# Patient Record
Sex: Male | Born: 1975 | Race: White | Hispanic: No | Smoking: Never smoker
Health system: Southern US, Community
[De-identification: ages and names within clinical notes are randomized; demographics above are authoritative.]

## PROBLEM LIST (undated history)

## (undated) DIAGNOSIS — T7840XA Allergy, unspecified, initial encounter: Secondary | ICD-10-CM

## (undated) DIAGNOSIS — C169 Malignant neoplasm of stomach, unspecified: Secondary | ICD-10-CM

## (undated) DIAGNOSIS — K2211 Ulcer of esophagus with bleeding: Principal | ICD-10-CM

## (undated) DIAGNOSIS — C159 Malignant neoplasm of esophagus, unspecified: Secondary | ICD-10-CM

## (undated) HISTORY — DX: Ulcer of esophagus with bleeding: K22.11

## (undated) HISTORY — DX: Allergy, unspecified, initial encounter: T78.40XA

## (undated) HISTORY — DX: Malignant neoplasm of stomach, unspecified: C16.9

## (undated) HISTORY — DX: Malignant neoplasm of esophagus, unspecified: C15.9

---
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
## 2004-05-07 ENCOUNTER — Ambulatory Visit: Payer: Self-pay | Admitting: Internal Medicine

## 2004-11-30 ENCOUNTER — Ambulatory Visit: Payer: Self-pay | Admitting: Internal Medicine

## 2006-03-04 ENCOUNTER — Ambulatory Visit: Payer: Self-pay | Admitting: Internal Medicine

## 2006-05-19 ENCOUNTER — Ambulatory Visit: Payer: Self-pay | Admitting: Internal Medicine

## 2006-07-28 ENCOUNTER — Ambulatory Visit: Payer: Self-pay | Admitting: Internal Medicine

## 2007-02-05 ENCOUNTER — Telehealth (INDEPENDENT_AMBULATORY_CARE_PROVIDER_SITE_OTHER): Payer: Self-pay | Admitting: *Deleted

## 2007-06-30 ENCOUNTER — Ambulatory Visit: Payer: Self-pay | Admitting: Family Medicine

## 2007-06-30 DIAGNOSIS — M6200 Separation of muscle (nontraumatic), unspecified site: Secondary | ICD-10-CM

## 2007-07-08 DIAGNOSIS — J309 Allergic rhinitis, unspecified: Secondary | ICD-10-CM | POA: Insufficient documentation

## 2008-07-18 ENCOUNTER — Ambulatory Visit: Payer: Self-pay | Admitting: Family Medicine

## 2008-07-18 DIAGNOSIS — J019 Acute sinusitis, unspecified: Secondary | ICD-10-CM

## 2011-06-18 DIAGNOSIS — C169 Malignant neoplasm of stomach, unspecified: Secondary | ICD-10-CM

## 2011-06-18 DIAGNOSIS — C159 Malignant neoplasm of esophagus, unspecified: Secondary | ICD-10-CM

## 2011-06-18 HISTORY — DX: Malignant neoplasm of stomach, unspecified: C16.9

## 2011-06-18 HISTORY — DX: Malignant neoplasm of esophagus, unspecified: C15.9

## 2011-07-19 DIAGNOSIS — K2211 Ulcer of esophagus with bleeding: Secondary | ICD-10-CM

## 2011-07-19 HISTORY — DX: Ulcer of esophagus with bleeding: K22.11

## 2011-08-08 LAB — COMPREHENSIVE METABOLIC PANEL
Albumin: 3.7 g/dL (ref 3.4–5.0)
Alkaline Phosphatase: 53 U/L (ref 50–136)
Anion Gap: 9 (ref 7–16)
Bilirubin,Total: 0.3 mg/dL (ref 0.2–1.0)
Calcium, Total: 8 mg/dL — ABNORMAL LOW (ref 8.5–10.1)
Creatinine: 1 mg/dL (ref 0.60–1.30)
EGFR (African American): >60
Glucose: 126 mg/dL — ABNORMAL HIGH (ref 65–99)
Osmolality: 289 (ref 275–301)
Potassium: 4.3 mmol/L (ref 3.5–5.1)
Sodium: 140 mmol/L (ref 136–145)

## 2011-08-08 LAB — CBC
HCT: 34.1 % — ABNORMAL LOW (ref 40.0–52.0)
MCV: 91 fL (ref 80–100)
Platelet: 329 10*3/uL (ref 150–440)
RBC: 3.74 10*6/uL — ABNORMAL LOW (ref 4.40–5.90)
WBC: 20.5 10*3/uL — ABNORMAL HIGH (ref 3.8–10.6)

## 2011-08-08 LAB — TROPONIN I: Troponin-I: <0.02 ng/mL

## 2011-08-09 ENCOUNTER — Inpatient Hospital Stay: Payer: Self-pay | Admitting: Internal Medicine

## 2011-08-09 LAB — CBC WITH DIFFERENTIAL/PLATELET
Basophil %: 0.5 %
Comment - H1-Com2: NORMAL
Eosinophil #: 0 10*3/uL (ref 0.0–0.7)
Eosinophil %: 0 %
HGB: 10.6 g/dL — ABNORMAL LOW (ref 13.0–18.0)
Lymphocyte #: 1.7 10*3/uL (ref 1.0–3.6)
Lymphocyte %: 10.4 %
Lymphocytes: 10 %
MCV: 92 fL (ref 80–100)
Monocytes: 1 %
Neutrophil %: 85.4 %
Platelet: 301 10*3/uL (ref 150–440)
RBC: 3.39 10*6/uL — ABNORMAL LOW (ref 4.40–5.90)
Segmented Neutrophils: 88 %
WBC: 16.1 10*3/uL — ABNORMAL HIGH (ref 3.8–10.6)

## 2011-08-09 LAB — HEMOGLOBIN
HGB: 10.6 g/dL — ABNORMAL LOW (ref 13.0–18.0)
HGB: 9.9 g/dL — ABNORMAL LOW (ref 13.0–18.0)

## 2011-08-09 LAB — URINALYSIS, COMPLETE
Bacteria: NONE SEEN
Bilirubin,UR: NEGATIVE
Glucose,UR: 50 mg/dL (ref 0–75)
Hyaline Cast: 6
Leukocyte Esterase: NEGATIVE
Nitrite: NEGATIVE
Ph: 5 (ref 4.5–8.0)
Protein: NEGATIVE
RBC,UR: <1 /HPF (ref 0–5)
Specific Gravity: 1.024 (ref 1.003–1.030)
WBC UR: <1 /HPF (ref 0–5)

## 2011-08-10 LAB — CBC WITH DIFFERENTIAL/PLATELET
Basophil #: 0.1 10*3/uL (ref 0.0–0.1)
Basophil %: 0.8 %
Eosinophil %: 0.8 %
HGB: 9.9 g/dL — ABNORMAL LOW (ref 13.0–18.0)
Lymphocyte #: 2.7 10*3/uL (ref 1.0–3.6)
MCV: 91 fL (ref 80–100)
Monocyte %: 3.3 %
Neutrophil #: 3.6 10*3/uL (ref 1.4–6.5)
Neutrophil %: 54.1 %
RBC: 3.18 10*6/uL — ABNORMAL LOW (ref 4.40–5.90)
RDW: 12.6 % (ref 11.5–14.5)

## 2011-08-19 ENCOUNTER — Encounter: Payer: Self-pay | Admitting: Family Medicine

## 2011-08-21 ENCOUNTER — Encounter: Payer: Self-pay | Admitting: Internal Medicine

## 2011-08-21 ENCOUNTER — Ambulatory Visit (INDEPENDENT_AMBULATORY_CARE_PROVIDER_SITE_OTHER): Payer: Self-pay | Admitting: Internal Medicine

## 2011-08-21 VITALS — BP 128/70 | HR 82 | Temp 97.5°F | Ht 70.0 in | Wt 157.0 lb

## 2011-08-21 DIAGNOSIS — K2211 Ulcer of esophagus with bleeding: Secondary | ICD-10-CM

## 2011-08-21 LAB — CBC WITH DIFFERENTIAL/PLATELET
Basophils Absolute: 0 10*3/uL (ref 0.0–0.1)
HCT: 32.6 % — ABNORMAL LOW (ref 39.0–52.0)
Lymphs Abs: 3 10*3/uL (ref 0.7–4.0)
MCHC: 33.3 g/dL (ref 30.0–36.0)
MCV: 90.3 fl (ref 78.0–100.0)
Monocytes Absolute: 0.5 10*3/uL (ref 0.1–1.0)
Platelets: 476 10*3/uL — ABNORMAL HIGH (ref 150.0–400.0)
RDW: 13.2 % (ref 11.5–14.6)

## 2011-08-21 NOTE — Assessment & Plan Note (Signed)
Really a mystery No sig NSAID or alcohol use No reflux symptoms at all Would not suspect cancer at his age in non smoker, but due to the mystery, repeat EGD may be indicated Should stay on the bid omeprazole---may need long term daily Rx (will leave to Dr Bluford Kaufmann)  Will recheck Hgb Check H pylori serology though not usually associated with esophageal ulcers

## 2011-08-21 NOTE — Progress Notes (Signed)
  Subjective:    Patient ID: [name], male    DOB: 1975/07/02, 36 y.o.   MRN: [id number]  HPI Had onset of presyncopal feelings Within an hour, he started vomiting blood Someone drove him to [facility]  Records reviewed Had EGD which showed esophageal ulcer and some gastritis No chronic heartburn Very little alcohol or NSAIDs  Stools are now normal Appetite is fine  No current outpatient prescriptions on file prior to visit.    No Known Allergies  Past Medical History  Diagnosis Date  . Allergy   . Esophageal ulcer with bleeding 2/13    Hematemesis/presyncope---EGD by Dr Bluford Kaufmann    Past Surgical History  Procedure Date  . Anterior cruciate ligament repair 1995    left knee    Family History  Problem Relation Age of Onset  . Healthy Mother   . Healthy Father   . Cancer Maternal Grandmother     breast cancer  . Cancer Maternal Grandfather     prostate cancer  . Diabetes Neg Hx   . Heart disease Neg Hx     History   Social History  . Marital Status: Divorced    Spouse Name: N/A    Number of Children: 1  . Years of Education: N/A   Occupational History  . Self employed     Occupational psychologist   Social History Main Topics  . Smoking status: Never Smoker   . Smokeless tobacco: Never Used  . Alcohol Use: Yes     rare  . Drug Use: No  . Sexually Active: Not on file   Other Topics Concern  . Not on file   Social History Narrative  . No narrative on file   Review of Systems Weight is stable over 3 years No cough or breathing problems Shop is well ventilated with fans, etc. Uses respirators    Objective:   Physical Exam  Constitutional: He appears well-developed and well-nourished. No distress.  HENT:  Mouth/Throat: Oropharynx is clear and moist. No oropharyngeal exudate.  Neck: Normal range of motion. Neck supple. No thyromegaly present.  Cardiovascular: Normal rate, regular rhythm and normal heart sounds.  Exam reveals no gallop.   No murmur  heard. Pulmonary/Chest: Effort normal and breath sounds normal. No respiratory distress. He has no wheezes. He has no rales.  Abdominal: Soft. He exhibits no distension and no mass. There is no tenderness. There is no rebound and no guarding.  Musculoskeletal: He exhibits no edema and no tenderness.  Lymphadenopathy:    He has no cervical adenopathy.  Psychiatric: He has a normal mood and affect. His behavior is normal. Judgment and thought content normal.          Assessment & Plan:

## 2011-08-21 NOTE — Progress Notes (Signed)
Addended by: Baldomero Lamy on: 08/21/2011 10:11 AM   Modules accepted: Orders

## 2011-08-22 ENCOUNTER — Encounter: Payer: Self-pay | Admitting: Internal Medicine

## 2011-08-22 LAB — HELICOBACTER PYLORI ABS-IGG+IGA, BLD: H Pylori IgG: <0.4 {ISR}

## 2011-09-04 ENCOUNTER — Encounter: Payer: Self-pay | Admitting: *Deleted

## 2012-03-02 ENCOUNTER — Ambulatory Visit: Payer: Self-pay | Admitting: Gastroenterology

## 2012-03-11 ENCOUNTER — Ambulatory Visit: Payer: Self-pay | Admitting: Gastroenterology

## 2012-03-11 ENCOUNTER — Encounter: Payer: Self-pay | Admitting: Internal Medicine

## 2012-03-24 ENCOUNTER — Encounter: Payer: Self-pay | Admitting: Internal Medicine

## 2012-04-06 ENCOUNTER — Ambulatory Visit: Payer: Self-pay | Admitting: Gastroenterology

## 2012-05-17 ENCOUNTER — Ambulatory Visit: Payer: Self-pay | Admitting: Hematology and Oncology

## 2012-05-21 ENCOUNTER — Ambulatory Visit: Payer: Self-pay | Admitting: Gastroenterology

## 2012-05-21 LAB — CBC WITH DIFFERENTIAL/PLATELET
Basophil #: 0 10*3/uL (ref 0.0–0.1)
Basophil %: 0.1 %
Eosinophil %: 0 %
HCT: 36.5 % — ABNORMAL LOW (ref 40.0–52.0)
HGB: 11.6 g/dL — ABNORMAL LOW (ref 13.0–18.0)
Lymphocyte %: 7.2 %
Monocyte #: 1.2 x10 3/mm — ABNORMAL HIGH (ref 0.2–1.0)
Monocyte %: 6 %
Neutrophil %: 86.7 %
Platelet: 299 10*3/uL (ref 150–440)
RBC: 4.65 10*6/uL (ref 4.40–5.90)
RDW: 15.9 % — ABNORMAL HIGH (ref 11.5–14.5)
WBC: 19.6 10*3/uL — ABNORMAL HIGH (ref 3.8–10.6)

## 2012-05-22 ENCOUNTER — Observation Stay: Payer: Self-pay | Admitting: Internal Medicine

## 2012-05-22 LAB — COMPREHENSIVE METABOLIC PANEL
Alkaline Phosphatase: 100 U/L (ref 50–136)
Anion Gap: 9 (ref 7–16)
Bilirubin,Total: 0.7 mg/dL (ref 0.2–1.0)
Calcium, Total: 9.1 mg/dL (ref 8.5–10.1)
Co2: 26 mmol/L (ref 21–32)
Creatinine: 1 mg/dL (ref 0.60–1.30)
EGFR (African American): >60
EGFR (Non-African Amer.): >60
Osmolality: 279 (ref 275–301)
Potassium: 4.3 mmol/L (ref 3.5–5.1)
SGPT (ALT): 18 U/L (ref 12–78)
Sodium: 141 mmol/L (ref 136–145)

## 2012-05-23 LAB — BASIC METABOLIC PANEL
Anion Gap: 6 — ABNORMAL LOW (ref 7–16)
BUN: 10 mg/dL (ref 7–18)
Calcium, Total: 8.5 mg/dL (ref 8.5–10.1)
Chloride: 105 mmol/L (ref 98–107)
Co2: 29 mmol/L (ref 21–32)
Creatinine: 0.93 mg/dL (ref 0.60–1.30)
EGFR (African American): >60
EGFR (Non-African Amer.): >60
Glucose: 106 mg/dL — ABNORMAL HIGH (ref 65–99)
Osmolality: 279 (ref 275–301)
Potassium: 4.1 mmol/L (ref 3.5–5.1)
Sodium: 140 mmol/L (ref 136–145)

## 2012-05-23 LAB — CBC WITH DIFFERENTIAL/PLATELET
Basophil #: 0 10*3/uL (ref 0.0–0.1)
Basophil %: 0.3 %
Eosinophil #: 0.1 10*3/uL (ref 0.0–0.7)
Eosinophil %: 0.7 %
HCT: 31.2 % — ABNORMAL LOW (ref 40.0–52.0)
HGB: 10.5 g/dL — ABNORMAL LOW (ref 13.0–18.0)
Lymphocyte #: 1.3 10*3/uL (ref 1.0–3.6)
Lymphocyte %: 12.1 %
MCH: 26.3 pg (ref 26.0–34.0)
MCHC: 33.6 g/dL (ref 32.0–36.0)
MCV: 78 fL — ABNORMAL LOW (ref 80–100)
Monocyte #: 0.9 x10 3/mm (ref 0.2–1.0)
Monocyte %: 8.5 %
Neutrophil #: 8.5 10*3/uL — ABNORMAL HIGH (ref 1.4–6.5)
Neutrophil %: 78.4 %
Platelet: 232 10*3/uL (ref 150–440)
RBC: 3.99 10*6/uL — ABNORMAL LOW (ref 4.40–5.90)
RDW: 15.8 % — ABNORMAL HIGH (ref 11.5–14.5)
WBC: 10.8 10*3/uL — ABNORMAL HIGH (ref 3.8–10.6)

## 2012-05-28 ENCOUNTER — Ambulatory Visit: Payer: Self-pay | Admitting: Hematology and Oncology

## 2012-05-28 LAB — CBC CANCER CENTER
Basophil #: 0.1 x10 3/mm (ref 0.0–0.1)
Basophil %: 1 %
Eosinophil #: 0.1 x10 3/mm (ref 0.0–0.7)
HCT: 37.2 % — ABNORMAL LOW (ref 40.0–52.0)
HGB: 12.7 g/dL — ABNORMAL LOW (ref 13.0–18.0)
Lymphocyte #: 1.7 x10 3/mm (ref 1.0–3.6)
Lymphocyte %: 22.1 %
MCHC: 34.3 g/dL (ref 32.0–36.0)
MCV: 79 fL — ABNORMAL LOW (ref 80–100)
Monocyte #: 0.6 x10 3/mm (ref 0.2–1.0)
Monocyte %: 8 %
Neutrophil #: 5.1 x10 3/mm (ref 1.4–6.5)
Neutrophil %: 67.5 %
Platelet: 413 x10 3/mm (ref 150–440)
RDW: 15.9 % — ABNORMAL HIGH (ref 11.5–14.5)

## 2012-05-28 LAB — COMPREHENSIVE METABOLIC PANEL
Albumin: 3.7 g/dL (ref 3.4–5.0)
BUN: 12 mg/dL (ref 7–18)
Bilirubin,Total: 0.2 mg/dL (ref 0.2–1.0)
Chloride: 105 mmol/L (ref 98–107)
Co2: 29 mmol/L (ref 21–32)
EGFR (African American): >60
EGFR (Non-African Amer.): >60
Osmolality: 281 (ref 275–301)
Potassium: 4.9 mmol/L (ref 3.5–5.1)
SGPT (ALT): 25 U/L (ref 12–78)
Total Protein: 8.1 g/dL (ref 6.4–8.2)

## 2012-05-28 LAB — PROTIME-INR: Prothrombin Time: 13.2 secs (ref 11.5–14.7)

## 2012-05-28 LAB — APTT: Activated PTT: 29.6 secs (ref 23.6–35.9)

## 2012-05-29 ENCOUNTER — Encounter: Payer: Self-pay | Admitting: Internal Medicine

## 2012-05-29 LAB — CEA: CEA: 1.1 ng/mL (ref 0.0–4.7)

## 2012-06-03 ENCOUNTER — Ambulatory Visit: Payer: Self-pay | Admitting: Internal Medicine

## 2012-06-03 DIAGNOSIS — Z0289 Encounter for other administrative examinations: Secondary | ICD-10-CM

## 2012-06-04 ENCOUNTER — Ambulatory Visit: Payer: Self-pay

## 2012-06-05 LAB — CBC CANCER CENTER
Basophil #: 0.1 x10 3/mm (ref 0.0–0.1)
Basophil %: 0.6 %
Eosinophil %: 1.3 %
HCT: 33.1 % — ABNORMAL LOW (ref 40.0–52.0)
HGB: 10.8 g/dL — ABNORMAL LOW (ref 13.0–18.0)
Lymphocyte #: 2.8 x10 3/mm (ref 1.0–3.6)
Lymphocyte %: 29 %
MCHC: 32.6 g/dL (ref 32.0–36.0)
Monocyte %: 6.4 %
Neutrophil %: 62.7 %
Platelet: 354 x10 3/mm (ref 150–440)
RBC: 4.2 10*6/uL — ABNORMAL LOW (ref 4.40–5.90)
WBC: 9.5 x10 3/mm (ref 3.8–10.6)

## 2012-06-05 LAB — COMPREHENSIVE METABOLIC PANEL
Albumin: 3.1 g/dL — ABNORMAL LOW (ref 3.4–5.0)
Anion Gap: 11 (ref 7–16)
BUN: 11 mg/dL (ref 7–18)
Bilirubin,Total: 0.3 mg/dL (ref 0.2–1.0)
Chloride: 103 mmol/L (ref 98–107)
Creatinine: 1.26 mg/dL (ref 0.60–1.30)
EGFR (African American): >60
EGFR (Non-African Amer.): >60
Osmolality: 289 (ref 275–301)
SGPT (ALT): 20 U/L (ref 12–78)
Sodium: 142 mmol/L (ref 136–145)

## 2012-06-11 ENCOUNTER — Encounter: Payer: Self-pay | Admitting: Internal Medicine

## 2012-06-17 ENCOUNTER — Ambulatory Visit: Payer: Self-pay | Admitting: Hematology and Oncology

## 2012-06-22 LAB — CBC CANCER CENTER
Basophil %: 0.7 %
Eosinophil %: 2.1 %
Lymphocyte %: 55 %
MCH: 25.5 pg — ABNORMAL LOW (ref 26.0–34.0)
MCHC: 32.3 g/dL (ref 32.0–36.0)
Monocyte #: 0.6 x10 3/mm (ref 0.2–1.0)
Monocyte %: 11.8 %
Neutrophil %: 30.4 %
Platelet: 186 x10 3/mm (ref 150–440)
RDW: 16.5 % — ABNORMAL HIGH (ref 11.5–14.5)

## 2012-06-22 LAB — COMPREHENSIVE METABOLIC PANEL
Albumin: 3.4 g/dL (ref 3.4–5.0)
Alkaline Phosphatase: 85 U/L (ref 50–136)
Anion Gap: 9 (ref 7–16)
Bilirubin,Total: 0.3 mg/dL (ref 0.2–1.0)
Calcium, Total: 8.7 mg/dL (ref 8.5–10.1)
Co2: 29 mmol/L (ref 21–32)
EGFR (Non-African Amer.): >60
Glucose: 96 mg/dL (ref 65–99)
SGOT(AST): 20 U/L (ref 15–37)
SGPT (ALT): 21 U/L (ref 12–78)
Sodium: 143 mmol/L (ref 136–145)
Total Protein: 6.6 g/dL (ref 6.4–8.2)

## 2012-06-26 LAB — CBC CANCER CENTER
Eosinophil %: 1.3 %
HCT: 32.8 % — ABNORMAL LOW (ref 40.0–52.0)
Lymphocyte #: 2.3 x10 3/mm (ref 1.0–3.6)
Lymphocyte %: 56.8 %
MCHC: 32.3 g/dL (ref 32.0–36.0)
Monocyte #: 0.1 x10 3/mm — ABNORMAL LOW (ref 0.2–1.0)
Monocyte %: 2.4 %
Neutrophil %: 38.8 %
Platelet: 269 x10 3/mm (ref 150–440)
RBC: 4.14 10*6/uL — ABNORMAL LOW (ref 4.40–5.90)
WBC: 4.1 x10 3/mm (ref 3.8–10.6)

## 2012-06-26 LAB — COMPREHENSIVE METABOLIC PANEL
Alkaline Phosphatase: 81 U/L (ref 50–136)
Anion Gap: 7 (ref 7–16)
BUN: 21 mg/dL — ABNORMAL HIGH (ref 7–18)
Bilirubin,Total: 0.3 mg/dL (ref 0.2–1.0)
Chloride: 108 mmol/L — ABNORMAL HIGH (ref 98–107)
Co2: 25 mmol/L (ref 21–32)
Creatinine: 0.94 mg/dL (ref 0.60–1.30)
EGFR (Non-African Amer.): >60
Glucose: 96 mg/dL (ref 65–99)
Osmolality: 282 (ref 275–301)
SGOT(AST): 31 U/L (ref 15–37)
Sodium: 140 mmol/L (ref 136–145)

## 2012-07-07 LAB — COMPREHENSIVE METABOLIC PANEL
Alkaline Phosphatase: 87 U/L (ref 50–136)
Anion Gap: 9 (ref 7–16)
Bilirubin,Total: 0.3 mg/dL (ref 0.2–1.0)
Calcium, Total: 8.5 mg/dL (ref 8.5–10.1)
Co2: 27 mmol/L (ref 21–32)
EGFR (African American): >60
EGFR (Non-African Amer.): >60
Osmolality: 287 (ref 275–301)
SGPT (ALT): 24 U/L (ref 12–78)
Sodium: 143 mmol/L (ref 136–145)

## 2012-07-07 LAB — CBC CANCER CENTER
Basophil #: 0 x10 3/mm (ref 0.0–0.1)
Basophil %: 0.8 %
Eosinophil %: 1.9 %
HCT: 30.4 % — ABNORMAL LOW (ref 40.0–52.0)
HGB: 10.1 g/dL — ABNORMAL LOW (ref 13.0–18.0)
Lymphocyte %: 45 %
MCHC: 33.2 g/dL (ref 32.0–36.0)
MCV: 78 fL — ABNORMAL LOW (ref 80–100)
Neutrophil #: 2.2 x10 3/mm (ref 1.4–6.5)
Neutrophil %: 41.9 %
Platelet: 197 x10 3/mm (ref 150–440)
RBC: 3.88 10*6/uL — ABNORMAL LOW (ref 4.40–5.90)
RDW: 16.7 % — ABNORMAL HIGH (ref 11.5–14.5)
WBC: 5.2 x10 3/mm (ref 3.8–10.6)

## 2012-07-18 ENCOUNTER — Ambulatory Visit: Payer: Self-pay | Admitting: Hematology and Oncology

## 2012-07-21 LAB — BASIC METABOLIC PANEL
Anion Gap: 9 (ref 7–16)
BUN: 19 mg/dL — ABNORMAL HIGH (ref 7–18)
Calcium, Total: 8.5 mg/dL (ref 8.5–10.1)
Chloride: 105 mmol/L (ref 98–107)
Co2: 28 mmol/L (ref 21–32)
Creatinine: 0.92 mg/dL (ref 0.60–1.30)
EGFR (African American): >60
EGFR (Non-African Amer.): >60
Glucose: 109 mg/dL — ABNORMAL HIGH (ref 65–99)
Osmolality: 286 (ref 275–301)
Potassium: 3.9 mmol/L (ref 3.5–5.1)

## 2012-07-21 LAB — CBC CANCER CENTER
Basophil #: 0 x10 3/mm (ref 0.0–0.1)
Basophil %: 0.7 %
Eosinophil #: 0.1 x10 3/mm (ref 0.0–0.7)
HGB: 9.9 g/dL — ABNORMAL LOW (ref 13.0–18.0)
MCH: 26.2 pg (ref 26.0–34.0)
MCV: 79 fL — ABNORMAL LOW (ref 80–100)
Monocyte #: 0.7 x10 3/mm (ref 0.2–1.0)
Monocyte %: 11.8 %
Platelet: 225 x10 3/mm (ref 150–440)
RDW: 17.1 % — ABNORMAL HIGH (ref 11.5–14.5)
WBC: 5.6 x10 3/mm (ref 3.8–10.6)

## 2012-08-04 LAB — BASIC METABOLIC PANEL
Anion Gap: 10 (ref 7–16)
BUN: 17 mg/dL (ref 7–18)
Chloride: 106 mmol/L (ref 98–107)
Co2: 28 mmol/L (ref 21–32)
EGFR (African American): >60
EGFR (Non-African Amer.): >60
Glucose: 145 mg/dL — ABNORMAL HIGH (ref 65–99)
Osmolality: 291 (ref 275–301)
Potassium: 3.6 mmol/L (ref 3.5–5.1)
Sodium: 144 mmol/L (ref 136–145)

## 2012-08-04 LAB — CBC CANCER CENTER
Basophil #: 0 x10 3/mm (ref 0.0–0.1)
Eosinophil #: 0.1 x10 3/mm (ref 0.0–0.7)
Eosinophil %: 1.8 %
HCT: 32.3 % — ABNORMAL LOW (ref 40.0–52.0)
HGB: 10.5 g/dL — ABNORMAL LOW (ref 13.0–18.0)
Lymphocyte #: 3.4 x10 3/mm (ref 1.0–3.6)
Lymphocyte %: 47.2 %
MCHC: 32.6 g/dL (ref 32.0–36.0)
MCV: 80 fL (ref 80–100)
Monocyte #: 0.9 x10 3/mm (ref 0.2–1.0)
Monocyte %: 12.8 %
Neutrophil #: 2.7 x10 3/mm (ref 1.4–6.5)
Platelet: 211 x10 3/mm (ref 150–440)
WBC: 7.2 x10 3/mm (ref 3.8–10.6)

## 2012-08-10 ENCOUNTER — Ambulatory Visit: Payer: Self-pay | Admitting: Hematology and Oncology

## 2012-08-15 ENCOUNTER — Ambulatory Visit: Payer: Self-pay | Admitting: Hematology and Oncology

## 2012-08-20 LAB — CBC CANCER CENTER
Basophil #: 0 x10 3/mm (ref 0.0–0.1)
Eosinophil #: 0.1 x10 3/mm (ref 0.0–0.7)
Eosinophil %: 1.1 %
HCT: 32.5 % — ABNORMAL LOW (ref 40.0–52.0)
Lymphocyte #: 2.7 x10 3/mm (ref 1.0–3.6)
Lymphocyte %: 40.6 %
MCH: 26.2 pg (ref 26.0–34.0)
MCV: 81 fL (ref 80–100)
Monocyte #: 0.8 x10 3/mm (ref 0.2–1.0)
Monocyte %: 11.6 %
Neutrophil #: 3.1 x10 3/mm (ref 1.4–6.5)
Platelet: 242 x10 3/mm (ref 150–440)
RBC: 4 10*6/uL — ABNORMAL LOW (ref 4.40–5.90)
RDW: 20.6 % — ABNORMAL HIGH (ref 11.5–14.5)
WBC: 6.7 x10 3/mm (ref 3.8–10.6)

## 2012-08-20 LAB — COMPREHENSIVE METABOLIC PANEL
Alkaline Phosphatase: 102 U/L (ref 50–136)
Anion Gap: 10 (ref 7–16)
Calcium, Total: 8.8 mg/dL (ref 8.5–10.1)
Chloride: 103 mmol/L (ref 98–107)
Creatinine: 1.25 mg/dL (ref 0.60–1.30)
EGFR (African American): >60
EGFR (Non-African Amer.): >60
Glucose: 105 mg/dL — ABNORMAL HIGH (ref 65–99)
Potassium: 3.9 mmol/L (ref 3.5–5.1)
Sodium: 142 mmol/L (ref 136–145)
Total Protein: 7.5 g/dL (ref 6.4–8.2)

## 2012-09-15 ENCOUNTER — Ambulatory Visit: Payer: Self-pay | Admitting: Hematology and Oncology

## 2012-10-21 ENCOUNTER — Ambulatory Visit: Payer: Self-pay | Admitting: Hematology and Oncology

## 2012-11-15 ENCOUNTER — Ambulatory Visit: Payer: Self-pay | Admitting: Hematology and Oncology

## 2013-01-15 ENCOUNTER — Ambulatory Visit: Payer: Self-pay | Admitting: Hematology and Oncology

## 2013-01-19 ENCOUNTER — Ambulatory Visit: Payer: Self-pay | Admitting: Hematology and Oncology

## 2013-04-29 ENCOUNTER — Ambulatory Visit: Payer: Self-pay | Admitting: Hematology and Oncology

## 2013-04-29 LAB — CBC CANCER CENTER
Basophil #: 0.2 x10 3/mm — ABNORMAL HIGH (ref 0.0–0.1)
Basophil %: 1.4 %
Eosinophil #: 0.2 x10 3/mm (ref 0.0–0.7)
Eosinophil %: 1 %
HCT: 30.7 % — ABNORMAL LOW (ref 40.0–52.0)
HGB: 10.3 g/dL — ABNORMAL LOW (ref 13.0–18.0)
MCH: 29.8 pg (ref 26.0–34.0)
MCV: 89 fL (ref 80–100)
Monocyte #: 0.8 x10 3/mm (ref 0.2–1.0)
Monocyte %: 5.5 %
Neutrophil #: 10.7 x10 3/mm — ABNORMAL HIGH (ref 1.4–6.5)
Neutrophil %: 68.7 %
Platelet: 948 x10 3/mm — ABNORMAL HIGH (ref 150–440)
RBC: 3.43 10*6/uL — ABNORMAL LOW (ref 4.40–5.90)
WBC: 15.5 x10 3/mm — ABNORMAL HIGH (ref 3.8–10.6)

## 2013-04-29 LAB — COMPREHENSIVE METABOLIC PANEL
Albumin: 2.7 g/dL — ABNORMAL LOW (ref 3.4–5.0)
Anion Gap: 5 — ABNORMAL LOW (ref 7–16)
Calcium, Total: 8.6 mg/dL (ref 8.5–10.1)
Chloride: 106 mmol/L (ref 98–107)
Creatinine: 1.2 mg/dL (ref 0.60–1.30)
EGFR (African American): >60
EGFR (Non-African Amer.): >60
SGOT(AST): 32 U/L (ref 15–37)
SGPT (ALT): 44 U/L (ref 12–78)
Sodium: 136 mmol/L (ref 136–145)
Total Protein: 7.8 g/dL (ref 6.4–8.2)

## 2013-05-04 ENCOUNTER — Ambulatory Visit: Payer: Self-pay | Admitting: Hematology and Oncology

## 2013-05-04 LAB — CULTURE, BLOOD (SINGLE)

## 2013-05-07 ENCOUNTER — Inpatient Hospital Stay: Payer: Self-pay | Admitting: Oncology

## 2013-05-07 LAB — CBC CANCER CENTER
Basophil #: 0.1 "x10 3/mm "
Basophil %: 0.6 %
Eosinophil #: 0 "x10 3/mm "
Eosinophil %: 0.3 %
HCT: 30.4 % — ABNORMAL LOW
HGB: 9.9 g/dL — ABNORMAL LOW
Lymphocyte %: 13.1 %
Lymphs Abs: 1.7 "x10 3/mm "
MCH: 29.7 pg
MCHC: 32.7 g/dL
MCV: 91 fL
Monocyte #: 1.6 "x10 3/mm " — ABNORMAL HIGH
Monocyte %: 12 %
Neutrophil #: 9.6 "x10 3/mm " — ABNORMAL HIGH
Neutrophil %: 74 %
Platelet: 382 "x10 3/mm "
RBC: 3.34 "x10 6/mm " — ABNORMAL LOW
RDW: 19.3 % — ABNORMAL HIGH
WBC: 13 "x10 3/mm " — ABNORMAL HIGH

## 2013-05-07 LAB — COMPREHENSIVE METABOLIC PANEL WITH GFR
Albumin: 2.4 g/dL — ABNORMAL LOW
Alkaline Phosphatase: 259 U/L — ABNORMAL HIGH
Anion Gap: 9
BUN: 16 mg/dL
Bilirubin,Total: 0.2 mg/dL
Calcium, Total: 8.6 mg/dL
Chloride: 103 mmol/L
Co2: 28 mmol/L
Creatinine: 1.24 mg/dL
EGFR (African American): >60
EGFR (Non-African Amer.): >60
Glucose: 76 mg/dL
Osmolality: 279
Potassium: 4 mmol/L
SGOT(AST): 31 U/L
SGPT (ALT): 46 U/L
Sodium: 140 mmol/L
Total Protein: 7.5 g/dL

## 2013-05-08 LAB — COMPREHENSIVE METABOLIC PANEL
Albumin: 2.2 g/dL — ABNORMAL LOW (ref 3.4–5.0)
Alkaline Phosphatase: 334 U/L — ABNORMAL HIGH
Anion Gap: 4 — ABNORMAL LOW (ref 7–16)
BUN: 20 mg/dL — ABNORMAL HIGH (ref 7–18)
Chloride: 108 mmol/L — ABNORMAL HIGH (ref 98–107)
Creatinine: 1.04 mg/dL (ref 0.60–1.30)
EGFR (African American): >60
EGFR (Non-African Amer.): >60
Osmolality: 279 (ref 275–301)
SGOT(AST): 68 U/L — ABNORMAL HIGH (ref 15–37)
Total Protein: 7 g/dL (ref 6.4–8.2)

## 2013-05-08 LAB — CBC WITH DIFFERENTIAL/PLATELET
Basophil #: 0.1 10*3/uL (ref 0.0–0.1)
Eosinophil #: 0 10*3/uL (ref 0.0–0.7)
HGB: 10 g/dL — ABNORMAL LOW (ref 13.0–18.0)
Lymphocyte #: 1.7 10*3/uL (ref 1.0–3.6)
MCH: 30.5 pg (ref 26.0–34.0)
MCHC: 33.9 g/dL (ref 32.0–36.0)
MCV: 90 fL (ref 80–100)
Monocyte %: 19.7 %
Neutrophil %: 57.7 %
Platelet: 415 10*3/uL (ref 150–440)
RBC: 3.29 10*6/uL — ABNORMAL LOW (ref 4.40–5.90)
RDW: 19.1 % — ABNORMAL HIGH (ref 11.5–14.5)
WBC: 7.8 10*3/uL (ref 3.8–10.6)

## 2013-05-09 LAB — COMPREHENSIVE METABOLIC PANEL
Albumin: 1.7 g/dL — ABNORMAL LOW (ref 3.4–5.0)
Alkaline Phosphatase: 333 U/L — ABNORMAL HIGH
BUN: 16 mg/dL (ref 7–18)
Bilirubin,Total: 0.2 mg/dL (ref 0.2–1.0)
Calcium, Total: 7.9 mg/dL — ABNORMAL LOW (ref 8.5–10.1)
Co2: 30 mmol/L (ref 21–32)
Creatinine: 0.82 mg/dL (ref 0.60–1.30)
Glucose: 87 mg/dL (ref 65–99)
SGOT(AST): 50 U/L — ABNORMAL HIGH (ref 15–37)
Sodium: 141 mmol/L (ref 136–145)

## 2013-05-12 ENCOUNTER — Ambulatory Visit: Payer: Self-pay | Admitting: Gastroenterology

## 2013-05-12 LAB — CULTURE, BLOOD (SINGLE)

## 2013-05-17 ENCOUNTER — Ambulatory Visit: Payer: Self-pay | Admitting: Hematology and Oncology

## 2013-05-17 LAB — CBC CANCER CENTER
Basophil %: 0.9 %
Eosinophil %: 2 %
HGB: 10 g/dL — ABNORMAL LOW (ref 13.0–18.0)
Lymphocyte #: 2.7 x10 3/mm (ref 1.0–3.6)
MCH: 31.7 pg (ref 26.0–34.0)
MCHC: 34.7 g/dL (ref 32.0–36.0)
MCV: 91 fL (ref 80–100)
Monocyte %: 6.3 %
Neutrophil #: 8.9 x10 3/mm — ABNORMAL HIGH (ref 1.4–6.5)
Neutrophil %: 69.8 %
Platelet: 724 x10 3/mm — ABNORMAL HIGH (ref 150–440)
RBC: 3.16 10*6/uL — ABNORMAL LOW (ref 4.40–5.90)
RDW: 19.5 % — ABNORMAL HIGH (ref 11.5–14.5)
WBC: 12.7 x10 3/mm — ABNORMAL HIGH (ref 3.8–10.6)

## 2013-05-17 LAB — COMPREHENSIVE METABOLIC PANEL
Alkaline Phosphatase: 244 U/L — ABNORMAL HIGH
Anion Gap: 9 (ref 7–16)
BUN: 14 mg/dL (ref 7–18)
Bilirubin,Total: 0.2 mg/dL (ref 0.2–1.0)
Calcium, Total: 8.1 mg/dL — ABNORMAL LOW (ref 8.5–10.1)
Glucose: 179 mg/dL — ABNORMAL HIGH (ref 65–99)
Osmolality: 282 (ref 275–301)
SGOT(AST): 35 U/L (ref 15–37)
Sodium: 139 mmol/L (ref 136–145)
Total Protein: 6.7 g/dL (ref 6.4–8.2)

## 2013-05-21 ENCOUNTER — Ambulatory Visit: Payer: Self-pay | Admitting: Hematology and Oncology

## 2013-05-22 LAB — URINE CULTURE

## 2013-05-27 LAB — CBC CANCER CENTER
Basophil #: 0.1 x10 3/mm (ref 0.0–0.1)
Basophil %: 0.8 %
HCT: 30 % — ABNORMAL LOW (ref 40.0–52.0)
Lymphocyte #: 1.5 x10 3/mm (ref 1.0–3.6)
Lymphocyte %: 14.3 %
MCHC: 33 g/dL (ref 32.0–36.0)
Monocyte #: 0.6 x10 3/mm (ref 0.2–1.0)
Monocyte %: 5.6 %
Neutrophil #: 8.5 x10 3/mm — ABNORMAL HIGH (ref 1.4–6.5)
Neutrophil %: 78.8 %
RBC: 3.25 10*6/uL — ABNORMAL LOW (ref 4.40–5.90)
RDW: 19.5 % — ABNORMAL HIGH (ref 11.5–14.5)
WBC: 10.8 x10 3/mm — ABNORMAL HIGH (ref 3.8–10.6)

## 2013-05-27 LAB — COMPREHENSIVE METABOLIC PANEL
Albumin: 2.7 g/dL — ABNORMAL LOW (ref 3.4–5.0)
Alkaline Phosphatase: 171 U/L — ABNORMAL HIGH
Anion Gap: 11 (ref 7–16)
BUN: 14 mg/dL (ref 7–18)
Bilirubin,Total: 0.1 mg/dL — ABNORMAL LOW (ref 0.2–1.0)
Chloride: 103 mmol/L (ref 98–107)
Co2: 25 mmol/L (ref 21–32)
Creatinine: 1.68 mg/dL — ABNORMAL HIGH (ref 0.60–1.30)
EGFR (African American): >60
EGFR (Non-African Amer.): 52 — ABNORMAL LOW
Osmolality: 281 (ref 275–301)
Potassium: 3.7 mmol/L (ref 3.5–5.1)
SGPT (ALT): 40 U/L (ref 12–78)
Sodium: 139 mmol/L (ref 136–145)
Total Protein: 7.5 g/dL (ref 6.4–8.2)

## 2013-05-28 ENCOUNTER — Ambulatory Visit: Payer: Self-pay | Admitting: Hematology and Oncology

## 2013-06-07 LAB — CBC CANCER CENTER
Basophil #: 0.1 x10 3/mm (ref 0.0–0.1)
Eosinophil #: 0.1 x10 3/mm (ref 0.0–0.7)
Eosinophil %: 1.4 %
HCT: 29.7 % — ABNORMAL LOW (ref 40.0–52.0)
HGB: 9.7 g/dL — ABNORMAL LOW (ref 13.0–18.0)
Lymphocyte #: 1.8 x10 3/mm (ref 1.0–3.6)
Lymphocyte %: 26 %
MCH: 31.4 pg (ref 26.0–34.0)
MCHC: 32.7 g/dL (ref 32.0–36.0)
MCV: 96 fL (ref 80–100)
Neutrophil #: 4.4 x10 3/mm (ref 1.4–6.5)
Platelet: 493 x10 3/mm — ABNORMAL HIGH (ref 150–440)
RBC: 3.08 10*6/uL — ABNORMAL LOW (ref 4.40–5.90)
RDW: 20.9 % — ABNORMAL HIGH (ref 11.5–14.5)

## 2013-06-07 LAB — COMPREHENSIVE METABOLIC PANEL
Albumin: 2.3 g/dL — ABNORMAL LOW (ref 3.4–5.0)
Anion Gap: 9 (ref 7–16)
BUN: 20 mg/dL — ABNORMAL HIGH (ref 7–18)
Co2: 23 mmol/L (ref 21–32)
EGFR (African American): 49 — ABNORMAL LOW
Glucose: 166 mg/dL — ABNORMAL HIGH (ref 65–99)
Potassium: 3.4 mmol/L — ABNORMAL LOW (ref 3.5–5.1)
SGPT (ALT): 59 U/L (ref 12–78)
Sodium: 139 mmol/L (ref 136–145)
Total Protein: 6.8 g/dL (ref 6.4–8.2)

## 2013-06-14 LAB — CBC CANCER CENTER
Basophil #: 0.1 x10 3/mm (ref 0.0–0.1)
Basophil %: 0.8 %
Eosinophil %: 0.5 %
HCT: 30.9 % — ABNORMAL LOW (ref 40.0–52.0)
HGB: 10.1 g/dL — ABNORMAL LOW (ref 13.0–18.0)
Lymphocyte #: 1.3 x10 3/mm (ref 1.0–3.6)
MCH: 31.5 pg (ref 26.0–34.0)
Monocyte #: 0.4 x10 3/mm (ref 0.2–1.0)
Neutrophil %: 78.3 %
RBC: 3.21 10*6/uL — ABNORMAL LOW (ref 4.40–5.90)
RDW: 19.6 % — ABNORMAL HIGH (ref 11.5–14.5)
WBC: 8.1 x10 3/mm (ref 3.8–10.6)

## 2013-06-14 LAB — COMPREHENSIVE METABOLIC PANEL
Anion Gap: 12 (ref 7–16)
Bilirubin,Total: 0.3 mg/dL (ref 0.2–1.0)
Calcium, Total: 8.4 mg/dL — ABNORMAL LOW (ref 8.5–10.1)
Chloride: 104 mmol/L (ref 98–107)
Creatinine: 3.31 mg/dL — ABNORMAL HIGH (ref 0.60–1.30)
EGFR (African American): 26 — ABNORMAL LOW
Osmolality: 283 (ref 275–301)

## 2013-06-16 LAB — BASIC METABOLIC PANEL
Anion Gap: 15 (ref 7–16)
BUN: 44 mg/dL — ABNORMAL HIGH (ref 7–18)
Calcium, Total: 8.8 mg/dL (ref 8.5–10.1)
Chloride: 105 mmol/L (ref 98–107)
Creatinine: 4.33 mg/dL — ABNORMAL HIGH (ref 0.60–1.30)
EGFR (African American): 19 — ABNORMAL LOW
EGFR (Non-African Amer.): 17 — ABNORMAL LOW
Glucose: 89 mg/dL (ref 65–99)
Osmolality: 286 (ref 275–301)

## 2013-06-16 LAB — CBC CANCER CENTER
Basophil #: 0.1 x10 3/mm (ref 0.0–0.1)
Basophil %: 0.5 %
Eosinophil #: 0 x10 3/mm (ref 0.0–0.7)
Eosinophil %: 0.2 %
HCT: 32.9 % — ABNORMAL LOW (ref 40.0–52.0)
HGB: 10.7 g/dL — ABNORMAL LOW (ref 13.0–18.0)
Monocyte #: 0.6 x10 3/mm (ref 0.2–1.0)
Monocyte %: 6 %
Neutrophil #: 7.6 x10 3/mm — ABNORMAL HIGH (ref 1.4–6.5)
Neutrophil %: 81.1 %
Platelet: 570 x10 3/mm — ABNORMAL HIGH (ref 150–440)
RBC: 3.43 10*6/uL — ABNORMAL LOW (ref 4.40–5.90)
RDW: 19.4 % — ABNORMAL HIGH (ref 11.5–14.5)
WBC: 9.4 x10 3/mm (ref 3.8–10.6)

## 2013-06-16 LAB — HEPATIC FUNCTION PANEL A (ARMC)
Bilirubin, Direct: 0.2 mg/dL (ref 0.00–0.20)
SGOT(AST): 98 U/L — ABNORMAL HIGH (ref 15–37)
SGPT (ALT): 121 U/L — ABNORMAL HIGH (ref 12–78)
Total Protein: 7.8 g/dL (ref 6.4–8.2)

## 2013-06-17 ENCOUNTER — Ambulatory Visit: Payer: Self-pay | Admitting: Hematology and Oncology

## 2013-06-18 LAB — CBC CANCER CENTER
BASOS ABS: 0 x10 3/mm (ref 0.0–0.1)
Basophil %: 0.2 %
EOS ABS: 0 x10 3/mm (ref 0.0–0.7)
EOS PCT: 0.1 %
HCT: 30.9 % — ABNORMAL LOW (ref 40.0–52.0)
HGB: 9.9 g/dL — AB (ref 13.0–18.0)
Lymphocyte #: 1 x10 3/mm (ref 1.0–3.6)
Lymphocyte %: 8.5 %
MCH: 30.6 pg (ref 26.0–34.0)
MCHC: 32.1 g/dL (ref 32.0–36.0)
MCV: 95 fL (ref 80–100)
MONO ABS: 0.8 x10 3/mm (ref 0.2–1.0)
Monocyte %: 6.4 %
NEUTROS ABS: 10.2 x10 3/mm — AB (ref 1.4–6.5)
Neutrophil %: 84.8 %
Platelet: 582 x10 3/mm — ABNORMAL HIGH (ref 150–440)
RBC: 3.24 10*6/uL — ABNORMAL LOW (ref 4.40–5.90)
RDW: 19.3 % — ABNORMAL HIGH (ref 11.5–14.5)
WBC: 12 x10 3/mm — ABNORMAL HIGH (ref 3.8–10.6)

## 2013-06-18 LAB — COMPREHENSIVE METABOLIC PANEL
ALT: 94 U/L — AB (ref 12–78)
AST: 70 U/L — AB (ref 15–37)
Albumin: 2.4 g/dL — ABNORMAL LOW (ref 3.4–5.0)
Alkaline Phosphatase: 613 U/L — ABNORMAL HIGH
Anion Gap: 14 (ref 7–16)
BILIRUBIN TOTAL: 0.3 mg/dL (ref 0.2–1.0)
BUN: 39 mg/dL — ABNORMAL HIGH (ref 7–18)
CALCIUM: 8.7 mg/dL (ref 8.5–10.1)
Chloride: 101 mmol/L (ref 98–107)
Co2: 22 mmol/L (ref 21–32)
Creatinine: 3.94 mg/dL — ABNORMAL HIGH (ref 0.60–1.30)
EGFR (African American): 21 — ABNORMAL LOW
EGFR (Non-African Amer.): 19 — ABNORMAL LOW
GLUCOSE: 102 mg/dL — AB (ref 65–99)
Osmolality: 283 (ref 275–301)
POTASSIUM: 3.3 mmol/L — AB (ref 3.5–5.1)
Sodium: 137 mmol/L (ref 136–145)
TOTAL PROTEIN: 7.5 g/dL (ref 6.4–8.2)

## 2013-06-22 ENCOUNTER — Ambulatory Visit: Payer: Self-pay | Admitting: Urology

## 2013-06-28 LAB — COMPREHENSIVE METABOLIC PANEL
ALBUMIN: 2.2 g/dL — AB (ref 3.4–5.0)
ALT: 110 U/L — AB (ref 12–78)
ANION GAP: 7 (ref 7–16)
AST: 96 U/L — AB (ref 15–37)
Alkaline Phosphatase: 780 U/L — ABNORMAL HIGH
BUN: 17 mg/dL (ref 7–18)
Bilirubin,Total: 0.2 mg/dL (ref 0.2–1.0)
Calcium, Total: 8.2 mg/dL — ABNORMAL LOW (ref 8.5–10.1)
Chloride: 103 mmol/L (ref 98–107)
Co2: 28 mmol/L (ref 21–32)
Creatinine: 1.58 mg/dL — ABNORMAL HIGH (ref 0.60–1.30)
EGFR (African American): >60
GFR CALC NON AF AMER: 56 — AB
Glucose: 98 mg/dL (ref 65–99)
Osmolality: 277 (ref 275–301)
POTASSIUM: 4.5 mmol/L (ref 3.5–5.1)
Sodium: 138 mmol/L (ref 136–145)
TOTAL PROTEIN: 7.2 g/dL (ref 6.4–8.2)

## 2013-06-28 LAB — CBC CANCER CENTER
BASOS ABS: 0.1 x10 3/mm (ref 0.0–0.1)
Basophil %: 0.7 %
EOS ABS: 0.1 x10 3/mm (ref 0.0–0.7)
Eosinophil %: 1.1 %
HCT: 28.8 % — AB (ref 40.0–52.0)
HGB: 9.3 g/dL — AB (ref 13.0–18.0)
LYMPHS ABS: 1.6 x10 3/mm (ref 1.0–3.6)
Lymphocyte %: 14.2 %
MCH: 31.6 pg (ref 26.0–34.0)
MCHC: 32.3 g/dL (ref 32.0–36.0)
MCV: 98 fL (ref 80–100)
Monocyte #: 1 x10 3/mm (ref 0.2–1.0)
Monocyte %: 9 %
NEUTROS ABS: 8.5 x10 3/mm — AB (ref 1.4–6.5)
Neutrophil %: 75 %
Platelet: 847 x10 3/mm — ABNORMAL HIGH (ref 150–440)
RBC: 2.94 10*6/uL — ABNORMAL LOW (ref 4.40–5.90)
RDW: 19.5 % — ABNORMAL HIGH (ref 11.5–14.5)
WBC: 11.3 x10 3/mm — AB (ref 3.8–10.6)

## 2013-07-18 ENCOUNTER — Ambulatory Visit: Payer: Self-pay | Admitting: Hematology and Oncology

## 2013-07-18 DEATH — deceased

## 2013-07-26 ENCOUNTER — Telehealth: Payer: Self-pay

## 2013-07-26 NOTE — Telephone Encounter (Signed)
Patient past away in [place] per Iver Nestle in [place]

## 2014-04-30 IMAGING — US US PELVIS LIMITED
1 series · 14 of 25 positions shown · non-contrast
Comparison: None.

CLINICAL DATA: Swelling left testicle.

EXAM:
SCROTAL ULTRASOUND
DOPPLER ULTRASOUND OF THE TESTICLES
TECHNIQUE: Complete ultrasound examination of the testicles, epididymis, and
other scrotal structures was performed. Color and spectral Doppler
ultrasound were also utilized to evaluate blood flow to the
testicles.

[Series 1: us pelvis limited · 0.08mm/px · 14 of 50 slices shown]
[im 1/50]
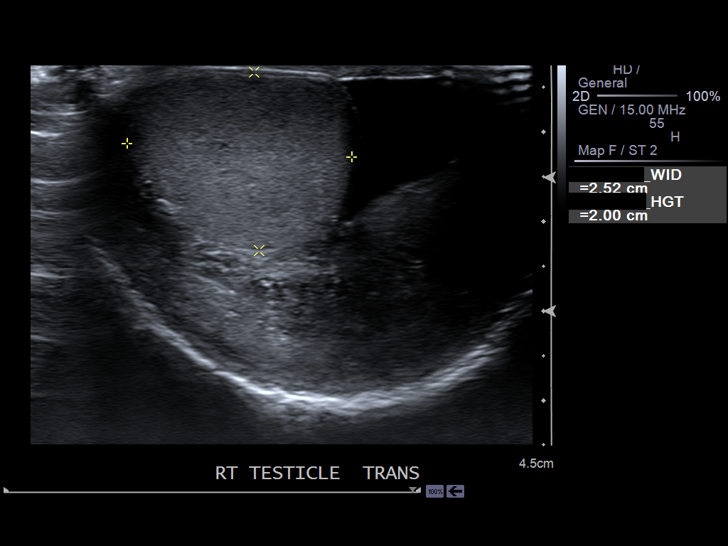
[im 5/50]
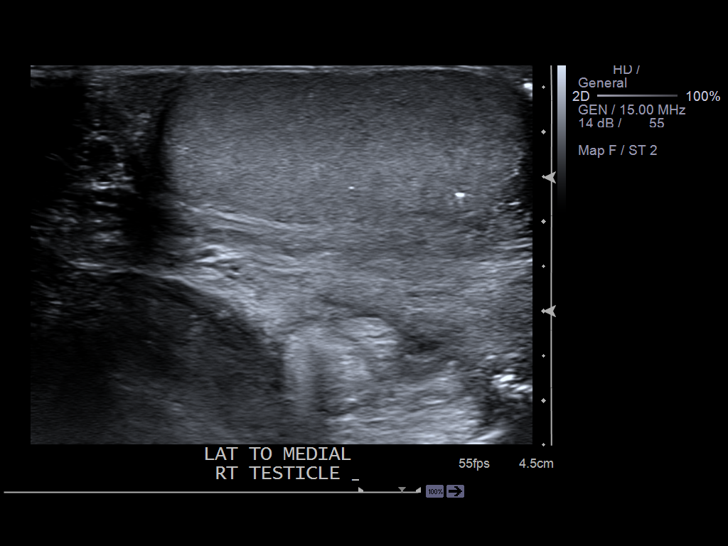
[im 9/50]
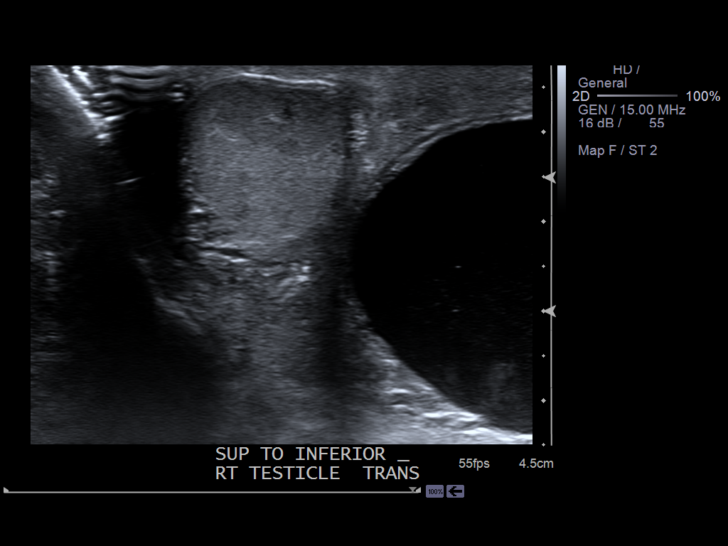
[im 13/50]
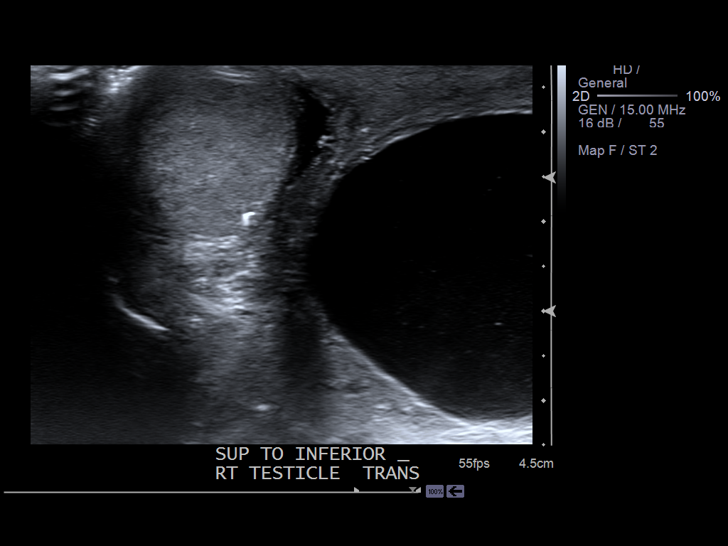
[im 17/50]
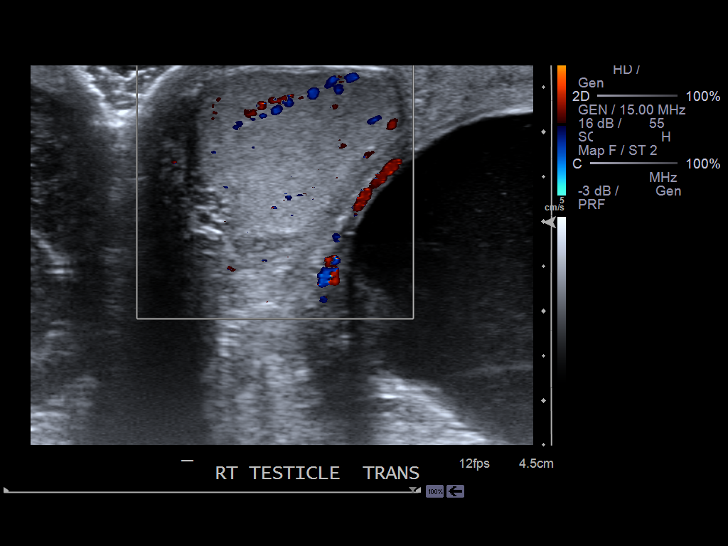
[im 19/50]
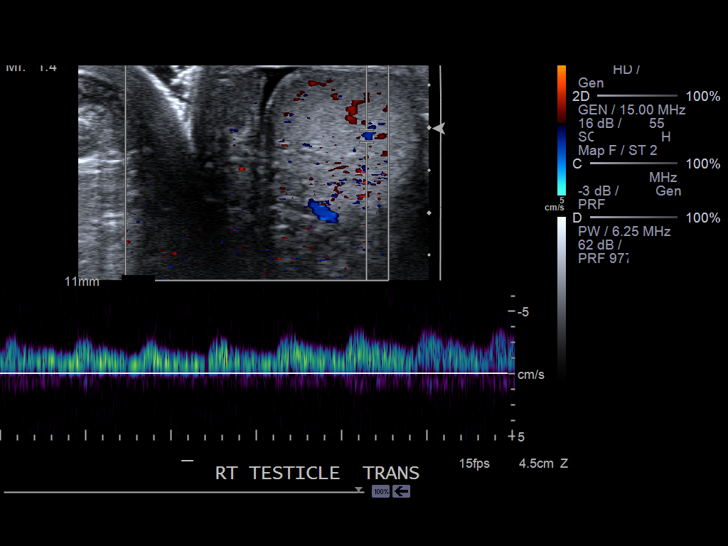
[im 23/50]
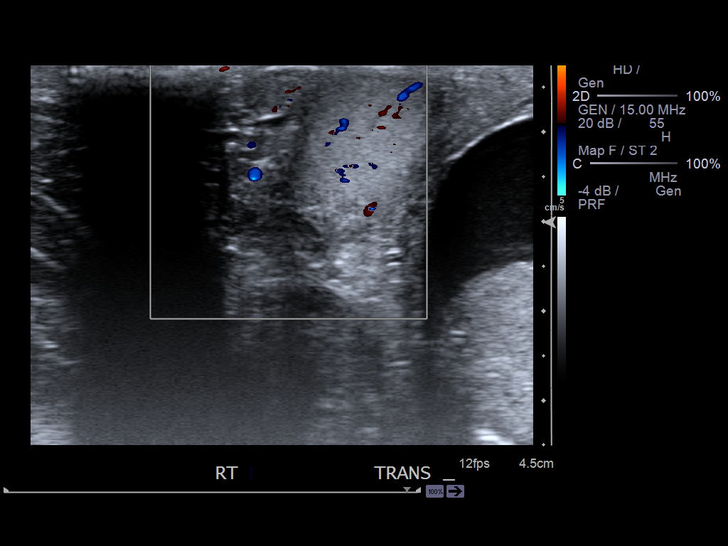
[im 27/50]
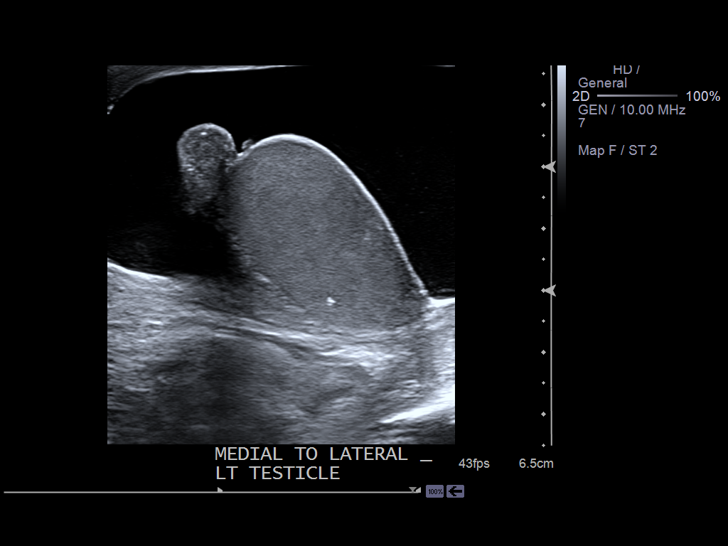
[im 31/50]
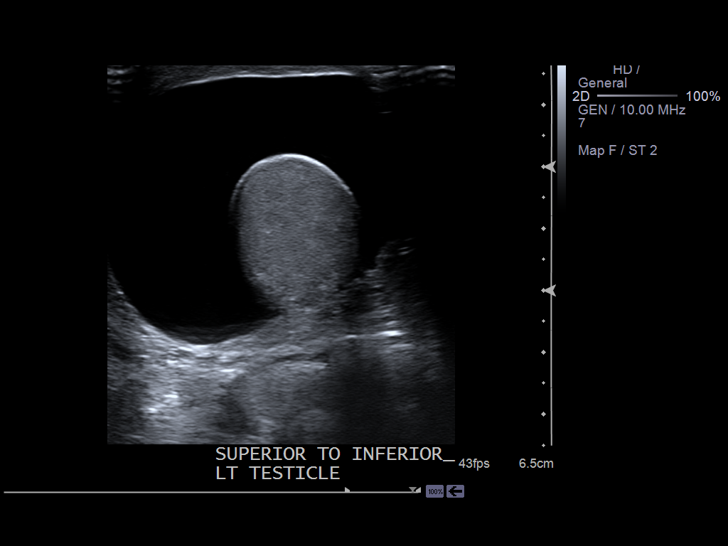
[im 33/50]
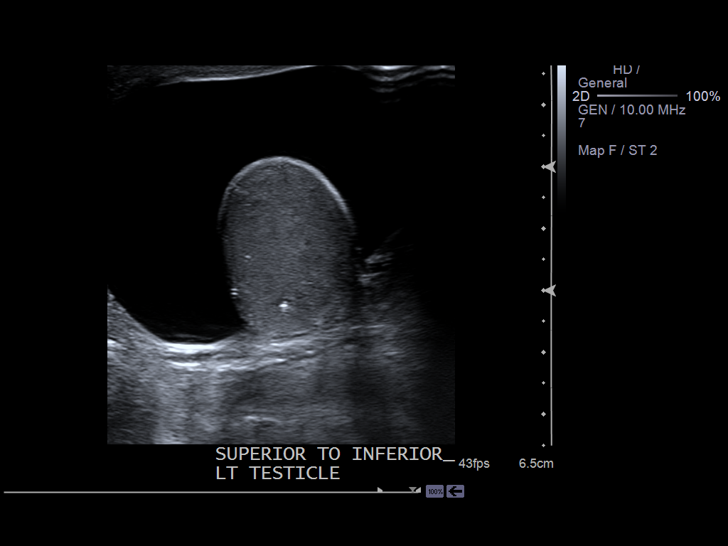
[im 37/50]
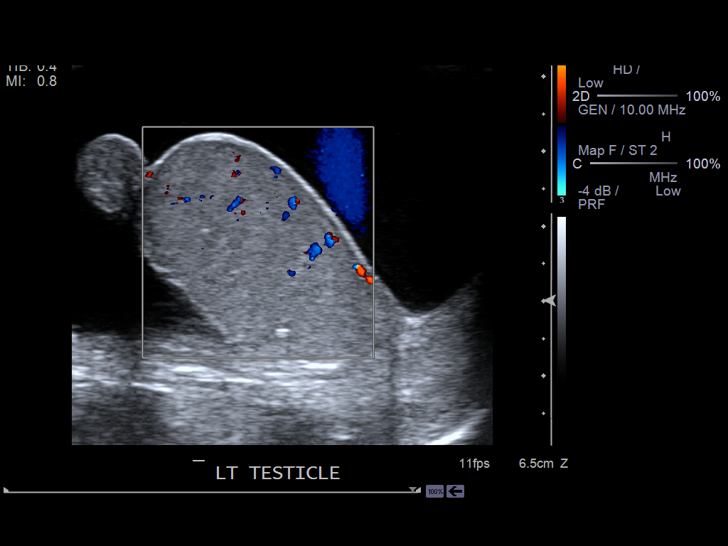
[im 41/50]
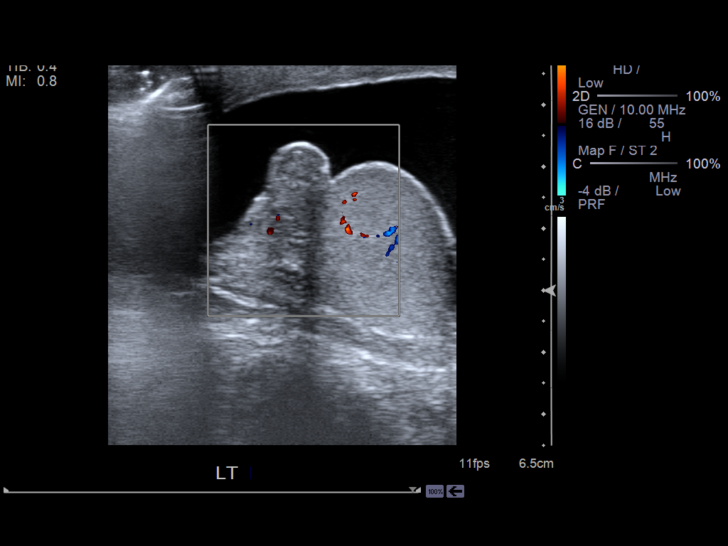
[im 45/50]
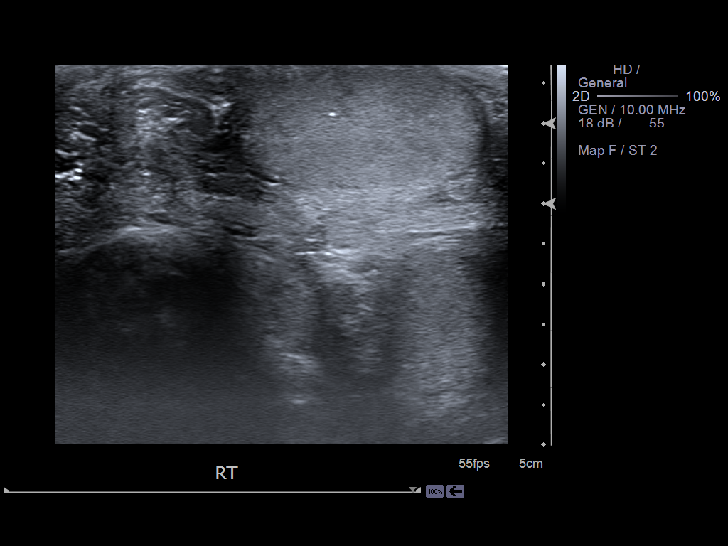
[im 50/50]
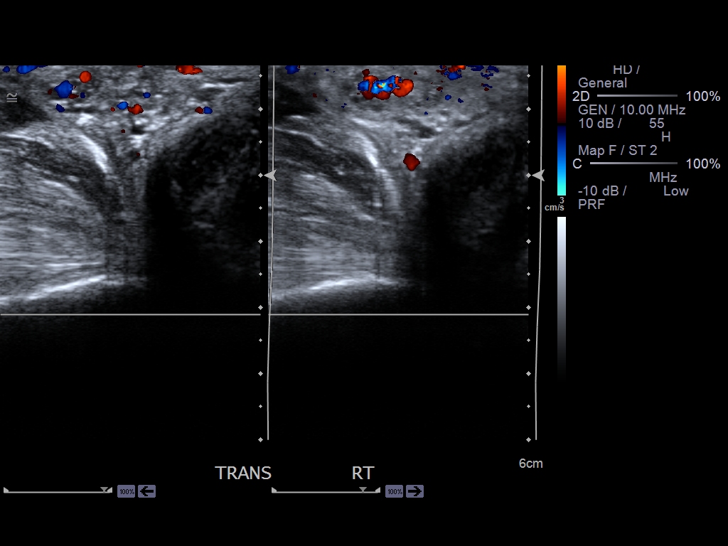

[14 of 25 positions shown; findings below may reference images not displayed]

FINDINGS: Right testicle

Measurements: 4.2 x 2.0 x 2.5 cm. No mass. Scattered punctate
calcifications which can be followed.

Left testicle

Measurements: 3.8 x 2.6 x 2.6 cm. No mass. Scattered punctate
calcifications which can be followed.

Right epididymis:  Normal in size and appearance.

Left epididymis:  Normal in size and appearance.

Hydrocele:  Prominent hydrocele on the left noted.

Varicocele:  None visualized.

Pulsed Doppler interrogation of both testes demonstrates low
resistance arterial and venous waveforms bilaterally.
IMPRESSION: 1. Scattered punctate calcifications both testicles. Follow-up
testicular ultrasound in 6 months to 1 year to demonstrate stability
suggested.
2. Prominent left hydrocele.  No torsion.

## 2014-10-04 NOTE — H&P (Signed)
PATIENT NAME:  [name], DEERY MR#:  [id number] DATE OF BIRTH:  November 22, 1975  DATE OF ADMISSION:  05/22/2012  PRIMARY CARE PHYSICIAN: Dr. Viviana Simpler   REFERRING PHYSICIAN: Dr. Owens Shark   CHIEF COMPLAINT: Chest pain after esophageal stent placement today.   HISTORY OF PRESENT ILLNESS: This is a 39 year old Caucasian male with a history of esophageal ulcer and stricture, gastritis, and GI bleeding who presented to the ED with chest pain after esophageal stent today. The patient has a history of esophageal ulcer and developed stricture. Dr. Candace Cruise placed an esophageal stent today but after stent placement the patient developed chest pain in the substernal area which is constant, waxing and waning, 10 out of 10 at most, nonradiating. The patient denies any fever or chills. No shortness of breath, cough, or wheezing. No nausea, vomiting, or diarrhea. Dr. Candace Cruise suggested admitting the patient for observation and pain control.   PAST MEDICAL HISTORY: As mentioned above, esophageal ulcer and stricture, gastritis, GI bleeding.   SOCIAL HISTORY: No smoking or drinking or illicit drugs.   FAMILY HISTORY: None.   PAST SURGICAL HISTORY: None.  REVIEW OF SYSTEMS: CONSTITUTIONAL: The patient denies any fever or chills. No headache or dizziness. No weakness. EYES: No double vision or blurred vision. ENT: No epistaxis, slurred speech, or dysphagia. RESPIRATORY: No cough, sputum, shortness of breath, or hemoptysis. GI: No abdominal pain, nausea, vomiting, or diarrhea. No melena or bloody stool. GU: No dysuria or hematuria. CARDIOVASCULAR: Chest pain. No palpitation, orthopnea, or nocturnal dyspnea. No leg edema. SKIN: No rash or jaundice. NEUROLOGY: No syncope, loss of consciousness, or seizure. HEMATOLOGY: No easy bruising or bleeding.   ALLERGIES: Percocet, Vicodin.   MEDICATIONS:  1. Tylenol with codeine every 4 to 6 hours p.r.n.  2. Protonix 1 tablet p.o. daily.   PHYSICAL EXAMINATION:   VITALS: Temperature  99.9, blood pressure 138/73, pulse 76, respirations 18, oxygen saturation 100% on room air.   GENERAL: The patient is alert, awake, oriented in no acute distress.   HEENT: Pupils round, equal, reactive to light and accommodation. Moist oral mucosa. Clear oropharynx.   NECK: Supple. No JVD or carotid bruits. No lymphadenopathy. No thyromegaly.   CARDIOVASCULAR: S1, S2 regular rate and rhythm. No murmurs or gallops.   PULMONARY: Bilateral air entry. No wheezing or rales. No use of accessory muscles to breathe.   ABDOMEN: Soft. No distention or tenderness. No organomegaly. Bowel sounds present.   EXTREMITIES: No edema, clubbing, or cyanosis. No calf tenderness. Strong bilateral pedal pulses.   SKIN: No rash or jaundice.   NEUROLOGY: Alert and oriented x3. No focal deficit. Power 5/5. Sensation intact.   LABORATORY, DIAGNOSTIC, AND RADIOLOGICAL DATA: CAT scan of chest no evidence of esophageal distention or perforation.   WBC 19.6, hemoglobin 11.6, platelets 299, glucose 70, BUN 12, creatinine 1. Electrolytes normal.   IMPRESSION: 1. Chest pain possibly due to esophageal stent placement today.  2. Esophageal stricture and ulcer.  3. Leukocytosis.  4. History of gastritis and GI bleeding.   PLAN OF TREATMENT:  1. The patient will be placed on observation.   2. Will keep n.p.o.  3. Give IV fluid support.  4. Morphine p.r.n.  5. Protonix IV. 6. Follow-up with Dr. Candace Cruise. 7. DVT prophylaxis.   Discussed the patient's situation and the plan of treatment with the patient.   TIME SPENT: About 45 minutes.   ____________________________ Demetrios Loll, MD qc:drc D: 05/22/2012 02:44:17 ET T: 05/22/2012 07:04:40 ET JOB#: [id number]  cc: Demetrios Loll, MD, <  Dictator> Venia Carbon, MD Demetrios Loll MD ELECTRONICALLY SIGNED 05/23/2012 14:03

## 2014-10-04 NOTE — Consult Note (Signed)
EGD showed majority of stent had migrated into stomach though top portion of stent does cover the malignant stricture. The stent was grabbed and gently pulled up the esophagus under fluoro. Decided against placing metal clip onto esophagus to prevent migration. THis is in case, surgery comtemplated soon. Otherwise, stent with clip on may not be removable. Full liquid diet ordered. Surgery/oncology consults placed. CT of chest and abdomen with contrast planned this afternoon. Dr. Vira Agar to see patient over the weekend. May need cxr in few days to make sure stent does not migrate again. Thanks   Electronic Signatures: Verdie Shire (MD) (Signed on 06-Dec-13 12:02)  Authored   Last Updated: 06-Dec-13 13:02 by Verdie Shire (MD)

## 2014-10-04 NOTE — Consult Note (Signed)
Was able to remove stent without too much difficulty.Stricture appears more patent with stent dilation. Full liquid diet ordered. Can even try soft foods later. As long as mass present, dysphagia will persist and may worsen over time.  [facility] can do EUS as outpt tomorrow afternoon at 1:30. They will fax all their info to the room later today. If patient stable, patient can be discharged later today. Make sure patient knows where to go tomorrow for EUS!!!. Make sure patient isn NPO after MN tonight. Continue PPI BID. Make sure patient has follow up with Dr. Genevive Bi and oncology later to review both PET scan and EUS results. THanks.   Electronic Signatures: Verdie Shire (MD) (Signed on 09-Dec-13 14:06)  Authored   Last Updated: 09-Dec-13 14:07 by Verdie Shire (MD)

## 2014-10-04 NOTE — Consult Note (Signed)
Brief Consult Note: Diagnosis: Adenocarcinoma GE Junction.   Patient was seen by consultant.   Consult note dictated.   Recommend to proceed with surgery or procedure.   Recommend further assessment or treatment.   Comments: GE Junction adenocarcinoma.  Agree with CT of the chest and abdomen.  EUS and PET for staging unless all information obtained on teh CT.  Also, would check HIV.  Electronic Signatures: Louis Matte (MD)  (Signed 06-Dec-13 15:06)  Authored: Brief Consult Note   Last Updated: 06-Dec-13 15:06 by Louis Matte (MD)

## 2014-10-04 NOTE — Consult Note (Signed)
PATIENT NAME:  [name], [name] MR#:  [id number] DATE OF BIRTH:  05/18/1976  DATE OF CONSULTATION:  05/22/2012  REFERRING PHYSICIAN:   CONSULTING PHYSICIAN:  Lupita Dawn. Brecklynn Jian, MD  REASON FOR REFERRAL: Chest pain after esophageal stent placement.   HISTORY OF PRESENT ILLNESS: The patient is a 39 year old white male who was initially diagnosed with GE junction ulcer back in February when he presented with hematemesis. The patient was placed on PPI at that time. We saw him again in August when he started to complain of dysphagia which was new and worsening heartburn. EGD done at that time still showed benign stricture right at the GE junction with mild esophagitis. Maloney dilation was attempted but I could not pass it deeply. The patient was given conscious sedation at that time. The patient grabbed the [facility] dilation and we were not able to finish the procedure. Because of the persistent dysphagia, EGD was repeated in September. Again, the same findings were noted. This time I used a TTS balloon to dilate the esophagus. Unfortunately, the patient continued to have heartburn-like symptoms despite being on PPI b.i.d. and H2-blocker at bedtime. The dysphagia never really improved. He was starting to lose weight. Therefore, we discussed about possibly placing a temporary esophageal metal stent in to keep the area open.   EGD was performed yesterday. This time the esophageal mucosa was grossly abnormal. You could see a nodular in the distal esophagus. Even the mucosa appeared somewhat irregular. This is completely different from what I saw back in September. Biopsies were taken. To allow the patient to swallow food adequately, an 18 mm length, 23 mm with retrieval, fully covered esophageal stent was placed successfully under fluoroscopy. Unfortunately, after he woke up he had a fair amount of discomfort in the chest area. Fentanyl was given which did help. We even discussed the possibility of bringing him overnight  for observation to control the pain. The patient refused. The patient also has had difficulty tolerating pain medications in the past. The only thing he can tolerate without feeling nauseous was Tylenol #3 which I prescribed to him. Unfortunately, the patient called the GI doctor on call and complained that Tylenol #3 was inadequate. Therefore, the patient was brought back into the Emergency Room for further evaluation.   Chest CT without contrast was performed. It showed that much of the esophageal stent had migrated into the stomach. Only the top portion of the stent appeared to be remaining in the esophagus.   The patient was given some morphine overnight which did help. This morning the patient feels much better. He did drink a little bit of water last night but did not eat anything afterwards.   PAST MEDICAL HISTORY: Esophageal ulcer, mild stricture, and gastritis.   PAST SURGICAL HISTORY: There is no surgical history.  MEDICATIONS: The only medications he was on at home include Protonix b.i.d. and Pepcid at bedtime.   SOCIAL HISTORY: He does not smoke or drink.   REVIEW OF SYSTEMS: No fevers or chills. There is no visual or hearing changes. There is no coughing or shortness of breath or chest pain. There is no palpitations but he was having some chest pain after the stent placement. There is no recent nausea, vomiting, diarrhea, gross hematochezia, or melena. The rest of the review of symptoms is negative.   ALLERGIES: The patient is allergic to Percocet and Vicodin.   PHYSICAL EXAMINATION:   GENERAL: The patient is in much better condition now.   VITAL SIGNS:  He is afebrile. His vital signs are stable.   HEAD AND NECK: Within normal limits.   CARDIAC: Regular rhythm and rate without murmurs.   LUNGS: Clear bilaterally.   ABDOMEN: Normoactive bowel sounds, soft and nontender. There is no hepatomegaly. He had active bowel sounds.   EXTREMITIES: No clubbing, cyanosis, or edema.    LABORATORY, DIAGNOSTIC, AND RADIOLOGICAL DATA: Electrolytes are completely normal. Liver enzymes are completely normal. White count is elevated at 19.6, hemoglobin 11.6, MCV 79. PT, INR negative.  CT again without contrast showed stent migration. No other abnormalities were found. There was some calcification in the kidney area. It did not really mention about the mass in the distal esophagus.   I talked to the pathologist this morning to expedite the biopsy that was taken yesterday. I got a call back later saying that it is poorly differentiated adenocarcinoma. I had discussed the possibility of malignancy yesterday with the patient and the family. I explained the final results to the patient. We will plan on doing a repeat endoscopy today to at least try to pull the stent up the esophagus. There is a possibility that the stent may migrate again distally. Sometimes I place a metal clip at the top of the stent to keep it in place. However, if there is a possibility of surgery, the stent may need to be removed. If the clip is in place, the clip may be hard to open up and remove the stent. Therefore, I will not place a clip today. We do need to do some x-rays to make sure the stent does not migrate.   More importantly because of the confirmation of cancer, I would like to repeat the chest CT with contrast this time. After discussing with Radiology, also do an abdominal CT with oral contrast to make sure there is extension of the tumor. We will also get Oncology and Surgery involved. If the CT scan is nondiagnostic, then the patient may even need endoscopic ultrasound for better staging later. If the surgery is contemplated soon, then we can remove the stent completely prior to surgery.   Thank you for the referral.   ____________________________ Lupita Dawn. Candace Cruise, MD pyo:drc D: 05/22/2012 13:02:38 ET T: 05/22/2012 13:27:25 ET JOB#: [id number]  cc: Lupita Dawn. Candace Cruise, MD, <Dictator> Lupita Dawn Kristain Filo MD ELECTRONICALLY  SIGNED 05/26/2012 20:13

## 2014-10-04 NOTE — Op Note (Signed)
PATIENT NAME:  [name], [name] MR#:  [id number] DATE OF BIRTH:  05/11/1976  DATE OF PROCEDURE:  06/04/2012  SURGEON: Louis Matte, M.D.   ASSISTANT: None.   PREOPERATIVE DIAGNOSIS: Stomach cancer.   POSTOPERATIVE DIAGNOSIS: Stomach cancer.   OPERATION PERFORMED: Insertion of right internal jugular ultrasound-guided Port-A-Cath.   INDICATIONS FOR PROCEDURE: The patient is a 39 year old gentleman with a history of stomach cancer who requires long-term intravenous access for chemotherapy. The indications and risks were explained to the patient who gave his informed consent.   DESCRIPTION OF PROCEDURE: The patient was brought to the operating suite and placed in the supine position. General anesthesia was given with laryngeal mask airway. The patient was prepped and draped in the usual sterile fashion. The ultrasound probe was used to identify the right internal jugular vein and under direct visualization this was percutaneously catheterized. A wire was placed into the inferior vena cava. A port site was then selected on the anterior chest wall and local anesthetic was infiltrated into this area. We then created a pocket just above the pectoralis fascia. The catheter was then tunneled from our pocket underneath the skin up to the entrance site at the internal jugular vein. Once this was complete, we then placed a peel-away dilator over the wire and the catheter was placed through the peel-away sheath. The catheter was positioned too far into the hepatic veins and was pulled back under fluoro. The Port-A-Cath was assembled and it was flushed. It was then tacked to the pectoralis fascia with three 2-0 Prolene sutures. The wound was irrigated and then closed with 3-0 Vicryl on the subcutaneous tissues and 4-0 nylon on the skin. 4-0 nylon was used to close the puncture site at the neck. Sterile dressings were applied. The patient was then taken to the recovery room in stable condition.      ____________________________ Lew Dawes Genevive Bi, MD teo:es D: 06/04/2012 17:17:29 ET T: 06/05/2012 08:56:11 ET JOB#: [id number]  cc: Christia Reading E. Genevive Bi, MD, <Dictator> Louis Matte MD ELECTRONICALLY SIGNED 06/07/2012 5:03

## 2014-10-04 NOTE — Consult Note (Signed)
Events of the weekend noted. Increasing abd pain either from esophageal mass or stent pusing against stomach. [facility] willing to schedule EUS tomorrow at 1:30 PM tomorrow as long as stent removed prior to EUS to get accurate staging of cancer. Discussed with Dr. Genevive Bi of situation. Pt to get PET scan around 11 AM today. Plan EGD this afternoon to remove stent. If stable, patient could be discharged later today after EGD. Must be NPO after MN tonight for EUS tomorrow. Then, f/u with Dr. Genevive Bi and oncology depeding on both EUS and PET scan results. Pt aware that dysphagia will persist or worsen with stent out. Pt wants to proceed. Thanks.  Electronic Signatures: Verdie Shire (MD)  (Signed on 09-Dec-13 10:08)  Authored  Last Updated: 09-Dec-13 10:08 by Verdie Shire (MD)

## 2014-10-04 NOTE — Consult Note (Signed)
Pt seen yesterday by me but I forgot to write a note. with a little more pain in chest which is likely related to pressure the stent is exerting on the ulcerated tumor.  Chest clear, abd flat, non distended, non tender.  Will agree with CXR to check location of stent but I doubt any intervention needed at this time.  Await oncology imput, timing of PET scan and Endoscopic ultrasound.  Electronic Signatures: Manya Silvas (MD)  (Signed on 08-Dec-13 10:19)  Authored  Last Updated: 08-Dec-13 10:19 by Manya Silvas (MD)

## 2014-10-04 NOTE — Consult Note (Signed)
PATIENT NAME:  ANUJ, SUMMONS MR#:  [id number] DATE OF BIRTH:  02-23-1976  DATE OF CONSULTATION:  05/22/2012  REFERRING PHYSICIAN:  Bronson Ing, MD.  CONSULTING PHYSICIAN:  Lew Dawes. Carletha Dawn, MD  REASON FOR CONSULTATION: Carcinoma of the esophagus.   I have personally seen and examined Mr. Bienvenido Proehl. I have independently reviewed his films and his electronic medical record. I have discussed his care with Dr. Bronson Ing.   HISTORY OF PRESENT ILLNESS: Mr. Dansby is a 39 year old gentleman who had an episode of upper gastrointestinal bleed in early February of this year when he underwent an upper gastrointestinal endoscopy and was found to have an esophageal ulcer. He was treated for reflux and ultimately was discharged to the hospital. He had an episode in the midportion of the summer where he again had severe pain in his upper abdomen and chest, was readmitted and had another endoscopy performed. This revealed a stricture as well as an ulcer. He again underwent therapy and he has had several subsequent endoscopies showing a stricture and an area of ulceration at the GE junction. He has lost approximately 20 pounds over the last several months. He states he has found it very difficult to swallow as he has a feeling of fullness and dysphagia with odynophagia. He does not get short of breath. Otherwise, liquids seem to go down much easier than solids. He was readmitted to the hospital for possible esophageal stent placement which was performed. Unfortunately, the stent has migrated and he is now admitted to the hospital for management of his esophageal stricture. During the procedure he underwent biopsy which revealed an adenocarcinoma of the GE junction based upon preliminary pathology reports.   PAST MEDICAL HISTORY: Multiple prior endoscopies. He has had several admissions for upper gastrointestinal bleed, dysphagia and management of his esophageal stricture. He denied any prior history of peptic ulcer  disease, reflux symptoms, weight loss, exposure to lye, tobacco use or alcohol use over the years. He specifically denied any family history of esophageal malignancy or reflux symptoms.   FAMILY HISTORY: Negative for peptic ulcer disease or inflammatory bowel disease. He does have a mother who suffers from diabetes.   SOCIAL HISTORY: He is a nonsmoker. There is no history of alcohol use. He works in an Environmental health practitioner. He is not married.   REVIEW OF SYSTEMS: As per history of present illness and all other review of systems were asked and were negative.   PHYSICAL EXAMINATION:  GENERAL: Examination revealed a thin, white male in no acute distress.   NECK: Supple without thyromegaly or adenopathy. There were no palpable masses.   LUNGS: His lungs were clear bilaterally.   HEART: His heart was regular.   ABDOMEN: Soft, nontender, nondistended. There were no palpable masses. There was no hepatosplenomegaly.   EXTREMITIES: His extremities were without clubbing, cyanosis, or edema. He had good pulses throughout.   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   I think that at the present time I agree with the CT scan of the chest and abdomen. He will likely require a PET scan and endoscopic ultrasound for complete staging. He may also require additional testing on the basis of these findings.   I would also check his HIV status and nutritional studies. Once the results of the CT scan are back, we can then make further recommendations.   Thank you very much for allowing me to participate in his care.   ____________________________  Belmont Valli E. Genevive Bi, MD teo:ap D: 05/22/2012 15:14:32 ET T: 05/22/2012 16:09:41 ET JOB#: [id number]  cc: Christia Reading E. Genevive Bi, MD, <Dictator> Louis Matte MD ELECTRONICALLY SIGNED 06/02/2012 6:42

## 2014-10-04 NOTE — Consult Note (Signed)
Full consult to follow.39 yo WM with persistent heartburn and dysphagia despite PPI BID and H2 blocker QHS and balloon dilation of esophagus in the past, who had repeat EGD yest with poss temporary esophageal stent placed.. This time distal esophageal mucosa was clearly abnormal with nodules present. Biopsies taken. Then, 76mm long, fully covered but retrieval esophageal stent placed. Had chest pain afterwards, requiring fentanyl. Discussed bringing in patient overnight for pain control. Pt refused. Sent home on tylenol #3. Pt has reaction to other narcotics. Came back to ER last night due to persistent CP. CT WITHOUT contrast showed no perforation but significant migration of stent into stomach. Feels better today. Would like to get CT WITH contrast today. Would like to reposition stent to cover the distal esophagus more adequately. 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 Therefore, will consult oncology and surgery.  May still need EUS to staging. Thanks.    Electronic Signatures: Verdie Shire (MD) (Signed on 06-Dec-13 09:13)  Authored   Last Updated: 06-Dec-13 09:34 by Verdie Shire (MD)

## 2014-10-04 NOTE — Consult Note (Signed)
Brief Consult Note: Diagnosis: Newly Diagnosed Esophageal adenoca.   Patient was seen by consultant.   Recommend further assessment or treatment.   Comments: Would agree with further staging with EUS PET CT CT Cabd/pelvis Discussed possible therapies including concurrent chemoradiation and surgery with patient,his fiancee and brother. Have scheduled outpatient appt to see me 06/02/12.  Electronic Signatures: Georges Mouse (MD)  (Signed 06-Dec-13 18:16)  Authored: Brief Consult Note   Last Updated: 06-Dec-13 18:16 by Georges Mouse (MD)

## 2014-10-04 NOTE — Consult Note (Signed)
PATIENT NAME:  [name], [name] MR#:  [id number] DATE OF BIRTH:  01/11/1976  DATE OF CONSULTATION:  05/22/2012  PRIMARY CARE PHYSICIAN: Viviana Simpler, MD   GASTROENTEROLOGIST: Verdie Shire, MD   CHIEF COMPLAINT: Difficulty swallowing.   BRIEF HISTORY: Mr. Leticia Mcdiarmid is a pleasant 39 year old gentleman seen in the hospital having been admitted earlier this morning with significant substernal chest pain. The patient has a complicated history dating back to February of 2013. He presented to the Emergency Room following an episode of syncope. He had an episode of upper gastrointestinal bleeding worked up with endoscopy which demonstrated a distal esophageal ulcer. He responded to PPI drug therapy and did well over the next several months. However, in June of 2013 he noted increasing dysphagia, difficulty swallowing any type of food, and began to develop some weight loss. He was re-evaluated in September. At that time dilatation was attempted.  Initial attempt at dilatation with a bougie was unsuccessful, and he was then brought back and then dilated with a balloon dilator. However, he feels his symptoms did not improve. He was seen again this week, and it was felt that he would benefit from placement of an esophageal stent. Biopsies were taken and an esophageal stent placed through the stricture. Biopsies returned adenocarcinoma today. The stent migrated last evening into his stomach and created significant abdominal pain. He presented back to the Emergency Room for evaluation and further therapy. He has been told the biopsy results by his primary physician.   He denies any other significant GI problems in the past.  There is no history of hepatitis, yellow jaundice, pancreatitis, peptic ulcer disease, gallbladder disease, or diverticulitis. He has had no previous problem with swallowing up until February of 2013. He did have an upper gastrointestinal series in the fall which demonstrated some distal esophageal  narrowing. Liquids would pass, but a barium pill of 12 mm would not pass. He has no other significant past history. He has no previous surgical history.   MEDICATIONS: He takes no medications regularly.   ALLERGIES: He has no medical allergies.   SOCIAL HISTORY: He does not smoke or drink alcohol.   REVIEW OF SYSTEMS: His review of systems is unremarkable other than significant weight loss up to 25 pounds over the last six months. He has not been taking his gastroesophageal reflux medications on a regular basis.   PHYSICAL EXAMINATION:  GENERAL: He is an alert, pleasant gentleman, obviously anxious and a bit in shock from the diagnosis.   VITAL SIGNS: He is afebrile. His blood pressure is 128/64, pulse rate 72 and regular, oxygen saturation are 96% on room air.   HEENT: Exam is unremarkable with no scleral icterus. No facial deformities. No pupillary   abnormalities.   NECK:  Neck is supple and nontender without adenopathy, and he has a midline trachea.   CHEST: His chest is clear with no adventitious sounds. He has normal pulmonary excursion.  CARDIAC: No murmurs or gallops. He seems to be in normal sinus rhythm.   ABDOMEN: His abdomen is benign with obvious weight loss but no tenderness, no organomegaly, no rebound, no guarding.   EXTREMITIES:  Exam was full range of motion with no deformities and normal pulsations.    NEUROLOGICAL:  Affect is a bit flat, but he is otherwise oriented and appropriate.  ASSESSMENT AND PLAN: I have independently reviewed his CT scan and his barium swallow. I have not seen the official pathology report, but it does appear that this is  an adenocarcinoma by verbal report. He is set for a contrasted CT scan this afternoon.   He will probably need an endoscopic ultrasound at some point to help stage the tumor if CT scanning is unable to demonstrate extraluminal disease. At his young age, I would be aggressive in treating him and would recommend because of  his significant stricture that we consider resection at some point in his therapy. Radiation and chemotherapy may be appropriate prior to surgical intervention if the tumor appears to be significantly advanced.   I discussed this plan with the patient in general and will ask our Thoracic surgeon, Dr. Marta Lamas, to see the patient for further evaluation and intervention suggestions. I answered the patient's questions to his stated  satisfaction.   ____________________________ Rodena Goldmann III, MD rle:cbb D: 05/22/2012 14:43:24 ET T: 05/22/2012 15:15:28 ET JOB#: [id number]  cc: Micheline Maze, MD, <Dictator> Venia Carbon, MD Lupita Dawn. Candace Cruise, MD Rodena Goldmann MD ELECTRONICALLY SIGNED 05/29/2012 16:59

## 2014-10-07 NOTE — Consult Note (Signed)
Chief Complaint:  Subjective/Chief Complaint seen for small bowel obstruction in the setting of h/o gastric cancer, gastic and partial esophageal resection, partial colon resection.  Patient feeling much better today, abdomen much less distended per patient, had small bm.  no current nausea or abdominal pain.   VITAL SIGNS/ANCILLARY NOTES: **Vital Signs.:   22-Nov-14 14:10  Vital Signs Type Routine  Temperature Temperature (F) 97.3  Celsius 36.2  Temperature Source oral  Pulse Pulse 85  Respirations Respirations 20  Systolic BP Systolic BP 034  Diastolic BP (mmHg) Diastolic BP (mmHg) 78  Mean BP 90  Pulse Ox % Pulse Ox % 100  Pulse Ox Activity Level  At rest  Oxygen Delivery Room Air/ 21 %  *Intake and Output.:   22-Nov-14 15:47  Stool  small - solid and fluid mix - reported per patient   Brief Assessment:  Cardiac Regular   Respiratory clear BS   Gastrointestinal details normal Soft  minimal distension, bowel sounds positive, occasional  high pitch  rush   Lab Results: Hepatic:  22-Nov-14 03:25   Bilirubin, Total 0.4  Alkaline Phosphatase  334 (45-117 NOTE: New Reference Range 05/07/13)  SGPT (ALT) 54  SGOT (AST)  68  Total Protein, Serum 7.0  Albumin, Serum  2.2  Routine Chem:  22-Nov-14 03:25   Result Comment LABS - This specimen was collected through an   - indwelling catheter or arterial line.  - A minimum of 10ms of blood was wasted prior    - to collecting the sample.  Interpret  - results with caution.  Result(s) reported on 08 May 2013 at 03:58AM.  Glucose, Serum 86  Creatinine (comp) 1.04  Sodium, Serum 139  Potassium, Serum 5.0  Chloride, Serum  108  CO2, Serum 27  Calcium (Total), Serum 8.6  Osmolality (calc) 279  eGFR (African American) >60  eGFR (Non-African American) >60 (eGFR values <636mmin/1.73 m2 may be an indication of chronic kidney disease (CKD). Calculated eGFR is useful in patients with stable renal function. The eGFR calculation  will not be reliable in acutely ill patients when serum creatinine is changing rapidly. It is not useful in  patients on dialysis. The eGFR calculation may not be applicable to patients at the low and high extremes of body sizes, pregnant women, and vegetarians.)  Anion Gap  4  Routine Hem:  22-Nov-14 03:25   WBC (CBC) 7.8  RBC (CBC)  3.29  Hemoglobin (CBC)  10.0  Hematocrit (CBC)  29.5  Platelet Count (CBC) 415  MCV 90  MCH 30.5  MCHC 33.9  RDW  19.1  Neutrophil % 57.7  Lymphocyte % 21.3  Monocyte % 19.7  Eosinophil % 0.3  Basophil % 1.0  Neutrophil # 4.5  Lymphocyte # 1.7  Monocyte #  1.5  Eosinophil # 0.0  Basophil # 0.1   Radiology Results: XRay:    22-Nov-14 07:45, Abdomen Flat and Erect  Abdomen Flat and Erect   REASON FOR EXAM:    Small bowel obstruction  COMMENTS:       PROCEDURE: DXR - DXR ABDOMEN 2 V FLAT AND ERECT  - May 08 2013  7:45AM     CLINICAL DATA:  Small bowel obstruction.    EXAM:  ABDOMEN - 2 VIEW    COMPARISON:  05/07/2013    FINDINGS:  Bilateral percutaneous nephrostomy tubes are in place. Abnormal  small bowel dilatation again identified. The small bowel loops  measure up to 5.6 cm. Upright images demonstrate multiple small  bowel fluid levels.     IMPRESSION:  1.  Persistent small-bowel obstruction pattern.      Electronically Signed    By: Kerby Moors M.D.    On: 05/08/2013 09:42         Verified By: Angelita Ingles, M.D.,   Assessment/Plan:  Assessment/Plan:  Assessment 1) intermittant sbo in the setting of estensive abdominal surgery for gastric cancer.  patietn reports multiple episodes of this though not as bad as this time, currently feeling much better after apparent spontaneous decompression via J tube and bm.  [name] has not been folowing particular dietary advice in regard to "digestibility" of foods, and I feel this may also be contributing to the problem.    He has had several dilations of a small bowel  stenosis, these not being ultimately successful.  Long term solution of the chronic intermittant sbo would be surgical, not recurrent dilitations, however he presents a very difficult revision in light of his history, this would likely need to be done at a tertiary institution.   Plan 1) will await  abd film in am before advancing diet beyond clears.  dietary consult for special dietary form/consistancy.  following.   Electronic Signatures: Loistine Simas (MD)  (Signed [phone] 16:58)  Authored: Chief Complaint, VITAL SIGNS/ANCILLARY NOTES, Brief Assessment, Lab Results, Radiology Results, Assessment/Plan   Last Updated: 22-Nov-14 16:58 by Loistine Simas (MD)

## 2014-10-07 NOTE — Consult Note (Signed)
PATIENT NAME:  [name], [name] MR#:  [id number] DATE OF BIRTH:  04-Jun-1976  DATE OF CONSULTATION:  05/07/2013  REFERRING PHYSICIAN:   CONSULTING PHYSICIAN:  Payton Emerald, NP  HISTORY OF PRESENT ILLNESS: [name] is a 39 year old Caucasian gentleman who actually was seen in the office by myself as well as Dr. Verdie Shire, was referred from Dr. Kallie Edward at [facility] for the concern of abdominal pain and consideration of J-tube placement. In lieu of this information, the patient has actually already had a J-tube that was placed. He has a significant medical history of esophageal cancer, diagnosed a year ago. He had moved to [place] and has had extensive surgery done at the [place], distal esophagus removal, complete gastrectomy, navel removal, and half of his colon, according to the patient. In speaking with Dr. 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 He was hospitalized for 29 days and suffered acute renal failure. He subsequently has bilateral nephrectomy tubes placed as well as J-tube placed as previously stated. The patient states he has required EGD with dilatation on a monthly basis, last one was September. Appears possibly push enteroscopy has been done during this time as he has had revision of stricture at the anastomotic site. The patient was eating a chicken wrap last night. Since eating solid food started to experience abdominal pain to right side of abdomen which has progressively to become generalized. Significant for abdominal bloating, distention. No nausea although he states "spitting up" intermittently chicken and lettuce since last night. He was seen by Dr. Kallie Edward this morning and had a CT scan of abdomen and pelvis done this morning with contrast which revealed at least partial small bowel obstruction. His last bowel movement was yesterday. Last 2 days stool has been solid, which has been usual for him, usually is very loose in consistency.  No fevers. He has a 45 pound weight loss since diagnosis of cancer.   PAST MEDICAL HISTORY:  1.  Esophageal ulcer with subsequent diagnosis of esophageal cancer. 2.  GERD. 3.  Acute renal failure status post bilateral nephrectomy tubes. 4.  Failure to thrive status post J-tube placement.   PAST SURGICAL HISTORY:  1.  ACL surgery of right knee in 1995. 2. Multiple EGDs with findings of esophageal ulcer as well as subsequent diagnosis of esophageal cancer. 3.  Multiple dilatations of anastomotic site.    MEDICATIONS:  1.  Oxycodone 15 mg 1 tablet every 4 hours as needed. 2.  Protonix 40 mg twice a daily.   ALLERGIES: No known drug allergies.   FAMILY HISTORY: Maternal grandfather with pancreatic cancer diagnosed in his 45s, grandmother, maternal with history of breast cancer. No colon cancer or colonic polyps.   SOCIAL HISTORY: No tobacco. No alcohol.   REVIEW OF SYSTEMS: All 10 systems reviewed and checked, otherwise unremarkable unless stated above.   PHYSICAL EXAMINATION: VITAL SIGNS: Height is 5 feet 9, weight is 105.8 pounds, and temperature 98.7 with a blood pressure of 134/92 and pulse is 95.  GENERAL: Well-developed, very slender 39 year old Caucasian gentleman. Flat affect. Depressive mood.  HEENT: Normocephalic, atraumatic. Pupils equal and reactive to light. Conjunctivae clear. Sclerae anicteric.  NECK: Supple. Trachea midline. No lymphadenopathy or thyromegaly.  LUNGS: Symmetric rise and fall of chest. Clear to auscultation throughout.  HEART:  Regular rhythm, S1 and S2.  ABDOMEN: Distended, marked discomfort throughout entire abdomen, more localized to right lower quadrant. Bowel sounds in 4 quadrants, slightly hypoactive. No  bruits. No masses. J-tube in place, left upper quadrant. Nephrostomy tubes in place, bilateral posterior flank areas. RECTAL: Deferred.  MUSCULOSKELETAL: Movement of all 4 extremities. No contractures. No clubbing.  EXTREMITIES: No edema.   NEUROLOGIC: No gross neurological deficits.  PSYCHIATRIC: Alert and oriented x4. Memory grossly intact. Flat affect, depressed mood.   LABORATORY AND DIAGNOSTICS: CT scan of abdomen and pelvis with contrast was done today with impression being portions of the colon stool filled suggestive of constipation. New or increased small bowel dilatation. Small volume of ascites noted. Again, extensive surgical changes including gastrectomy. A trace of bilateral pleural effusion. Intrahepatic biliary ductal dilatation without common bile duct dilatation. Bilateral nephrostomy tubes in place. Hepatic steatosis and subtle thoracic spine and right rib osseous foci. Looks like there is a subsequent sclerotic lesion on the left side of T11 vertebral body likely new.   CBC: WBC count 13, hemoglobin 9.9 with hematocrit of 30.4, MCV, MCH and MCHC all within normal limits. Comprehensive metabolic panel essentially within normal limits, except alk phos elevated at 259. Albumin low at 2.4.   IMPRESSION:  1.  Known history of esophageal cancer.  2.  Failure to thrive. 3.  Small bowel obstruction.  4.  Generalized abdominal pain.   PLAN: The patient was seen and evaluated by Dr. Verdie Shire during our office visit. The patient has been directly admitted by Dr. Kallie Edward. In speaking with her,  recommend decompression to be attempted through J-tube site. If unsuccessful, NG tube may be able to be attempted and placed. I have spoken with Dr. Loistine Simas who will assume GI care over the weekend. Strongly recommends surgical consultation. Given his history of stricture, possible that the patient will require surgery. Unable to safely attempt EGD at this time as he has had IV contrast today. This has been discussed with the anesthesiologist, Dr. Benjamine Mola. The patient is high risk for consideration of endoscopy procedure at this point. We will continue to monitor. N.p.o.  These services provided by Payton Emerald, MS, APRN, [facility], FNP  under collaborative agreement with Loistine Simas, MD.  ____________________________ Payton Emerald, NP dsh:sb D: 05/07/2013 14:42:08 ET T: 05/07/2013 15:09:06 ET JOB#: [id number]  cc: Payton Emerald, NP, <Dictator> Payton Emerald MD ELECTRONICALLY SIGNED 05/19/2013 18:15

## 2014-10-07 NOTE — Discharge Summary (Signed)
PATIENT NAME:  [name], THONE MR#:  [id number] DATE OF BIRTH:  1975-12-19  DATE OF ADMISSION:  05/22/2012 DATE OF DISCHARGE:  05/25/2012  PRIMARY CARE PHYSICIAN:  Dr. Silvio Pate  PRESENTING COMPLAINT: Abdominal pain and chest pain.   DISCHARGE DIAGNOSES:  1. 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 Biopsy shows adenocarcinoma.  2. Gastroesophageal reflux disease.  CONDITION ON DISCHARGE: Fair.   MEDICATIONS:  1. Tylenol with Codeine, 1 tablet every 4 to 6 hours as needed.  2. Protonix 40 mg p.o. b.i.d.   DIET: Mechanical soft, regular.   FOLLOWUP:  1. Follow up with Dr. Genevive Bi in 1 to 2 weeks.  2. Follow up with Dr. Candace Cruise in 1 to 2 weeks.   DISCHARGE INSTRUCTIONS:  The patient is supposed to go to [facility] for endoscopic ultrasound scheduled for 1:30 p.m. on 05/26/2012. The patient's needs to be given appt papers for [facility] at discharge.    PROCEDURES:  1. Upper GI endoscopy done on 12/06 shows normal stomach, normal examined duodenum. Most of the esophageal stent had migrated into the stomach, although the upper portion of the stent covers the stricture. String attached to the stent was grabbed with rat tooth forceps and then was gradually pulled until 42 cm was done under fluoro.  2. Upper GI endoscopy repeated on 12/09 showed findings of metal stent embedded in the distal esophagus and extending into the stomach. The string at the upper end of the stent was grabbed and pulled with forceps, which closed the upper end of the stent. The stent was gradually pulled out of the mouth under fluoroscopy.  A malignant-appearing intrinsic mild stenosis was found in the lower third of the esophagus. The stricture has opened up since dilation with the stent. Diffuse mild erythematous mucosa was found in the gastric fundus and in the gastric body.   CONSULTANTS:  1. GI, Dr. Candace Cruise. 2. General surgery, Dr. Marterre/Dr. Pat Patrick.  3. Cardiothoracic surgeon, Dr.  Genevive Bi.   RESULTS: PET scan of esophageal cancer:  Initial staging FDG  avidity appreciated on the patient's stent in the region of the distal esophagus extending to the stomach. Areas of disease within and around the soft tissues of the stent cannot be excluded. No further foci of  abnormal hypermetabolic activity identified. Chest x-ray done 12/09 showed esophageal stent similar to prior, only a short segment overlying the distal esophagus and the majority of the stent overlying the stomach.   White count is 10.8, hemoglobin and hematocrit 10.5 and 31.2, platelet count 232. Glucose 106, BUN 10. The rest of the chemistries within normal limits.   CT of the chest and abdomen on admission showed there is a stent in the distal esophagus and the stomach which traverses masslike soft tissue thickening in the region of the gastroesophageal junction and gastric cardia. The proximal segment of the stent is only in the distal esophagus and the distal aspect of stent abuts the anterior wall of the stomach. Extension of the stent through the wall of the stomach cannot be excluded on these images. There is a mild amount of ascites, which is increased from prior. There is nonspecific soft tissue thickening of the gastric body and antrum. There is suggestion of several mildly prominent lymph nodes adjacent to the gastric cardia.   CT of the chest without contrast shows appearance of esophageal stent suggests that it was either placed very low in the esophagus or has migrated into the stomach such that only a  small amount remains in the esophagus.  It is entirely possible that placement is as originally desired, but migration distally is suspected.  There is no evidence of esophageal distention proximal to the stent. No acute cardiopulmonary abnormality.  Small amount of fluid around the liver that is of uncertain etiology. Punctate calcifications in the kidneys.     BRIEF SUMMARY OF HOSPITAL COURSE:  Dorr Perrot is a  39 year old gentleman with history of gastroesophageal reflux disease status post EGD in February 2013.  He has had his third EGD done as outpatient and had an esophageal stent placement for stricture and biopsy. He was admitted with:  1. Chest pain, substernal, secondary to esophageal stricture and stent dislocation. The patient underwent first esophagogastroduodenoscopy on 12/06 by Dr. Candace Cruise and his stent was readjusted. He continued to have increasing discomfort in his epigastric region and chest pain and hence the decision was made to repeat endoscopy and removal of the stent was done. The patient tolerated the procedure well, tolerated liquids after the procedure, and was discharged to home in stable condition.  2. 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 The patient's CT chest and abdomen results as above were noted. He was seen by cardiothoracic surgeon Dr. Genevive Bi and recommended endoscopic ultrasound at [facility] in order to facilitate appropriate staging. The patient's appointment has been made for 1:30 p.m. at [facility] on 05/26/2012.  PET scan was completed, results as above were noted. The patient is to follow up with Dr. Genevive Bi, Dr. Candace Cruise, and Dr. Silvio Pate as outpatient.  3. Gastroesophageal reflux disease. Protonix p.o. b.i.d. was prescribed.  4. Hospital stay otherwise remained stable.  5. CODE STATUS: The patient remained a FULL CODE.   TIME SPENT: 40 minutes.    ____________________________ Hart Rochester Posey Pronto, MD sap:bjt D: 05/26/2012 07:18:38 ET T: 05/26/2012 12:52:13 ET JOB#: [id number]  cc: Darrick Greenlaw A. Posey Pronto, MD, <Dictator> Venia Carbon, MD Lew Dawes. Genevive Bi, MD Lupita Dawn. Candace Cruise, MD Ilda Basset MD ELECTRONICALLY SIGNED 06/17/2012 10:32

## 2014-10-07 NOTE — Consult Note (Signed)
Brief Consult Note: Diagnosis: SBO, History of esophageal cancer.  Generalized abdominal pain.   Consult note dictated.   Discussed with Attending MD.   Comments: Patient was seen in our office today by Dr. Verdie Shire and I have spoken with Dr. Loistine Simas who will be assuming patient's care over the weekend.  Strongly recommend surgical consultation.  NPO status.  Full dictation to follow. Will continue to monitor.  Electronic Signatures: Payton Emerald (NP)  (Signed 21-Nov-14 14:43)  Authored: Brief Consult Note   Last Updated: 21-Nov-14 14:43 by Payton Emerald (NP)

## 2014-10-08 NOTE — Op Note (Signed)
PATIENT NAME:  [name], [name] MR#:  [id number] DATE OF BIRTH:  [birth year]  DATE OF PROCEDURE:  06/22/2013  PRINCIPAL DIAGNOSES:  1.  Bilateral hydronephrosis.  2.  Acute renal failure.  3.  Metastatic gastric cancer.   POSTOPERATIVE DIAGNOSES:  1.  Bilateral hydronephrosis.  2.  Acute renal failure.  3.  Metastatic gastric cancer.   PROCEDURE: Cystoscopy and bilateral double-J ureteral stent placement.   SURGEON: Edrick Oh, M.D.   ANESTHESIA: Laryngeal mask airway anesthesia.   INDICATIONS: The patient is a 39 year old gentleman with metastatic gastric cancer. He has presumed significant lymphadenopathy resulting in hydronephrosis. He recently had in nephrostomy tubes for renal failure related to obstruction. He had the stent removed recently. He has developed a rising serum creatinine. He is planning on a trip out of the country and did not desire nephrostomy tubes replaced. He presents for bilateral stent placement.   DESCRIPTION OF PROCEDURE: After informed consent was obtained, the patient was taken to the operating room and placed in the dorsal lithotomy position under laryngeal mask airway anesthesia. The patient was then prepped and draped in the usual standard fashion. The 22-French rigid cystoscope was introduced into the urethra under direct vision with no urethral abnormalities noted. Upon entering the prostatic fossa, no significant prostatic hypertrophy was noted with no significant visual obstruction. The bladder neck was noted to be fairly fixed. Upon entering the bladder, the mucosa was inspected in its entirety with no gross mucosal lesions noted. Bilateral ureteral orifices were well visualized with no lesions noted. A flexible tip Glidewire was first introduced into the right ureteral orifice. It was easily advanced into the upper pole collecting system under fluoroscopic guidance without difficulty. The resonance stent sheath was then advanced over the guidewire to the level  of the renal pelvis. The obturator of the sheath was removed. A 6-French x 24 cm resonance stent was advanced through the sheath to the level of the renal pelvis. The sheath was then withdrawn under both visual and fluoroscopic guidance. Adequate curl was noted of the stent within the renal pelvis. Adequate curl was also noted within the urinary bladder. The guidewire was then advanced into the left ureteral orifice. It was also easily advanced into the upper pole collecting system without difficulty. The sheath was then advanced over the guidewire. Slight resistance was met during passage at the level of the crossing vessels; however, this was overall minimal. The sheath was advanced to the level of the renal pelvis. The obturator was removed. A 6-French x 24 cm double-J ureteral stent was then advanced through the sheath to the level of the renal pelvis. The sheath was then withdrawn. Adequate curl was noted within the renal pelvis and within the urinary bladder. The sheath was withdrawn under both fluoroscopic and visual guidance.   Also of note, both sides demonstrated fairly high pressure urine return prior to stent advancement through the sheath. The urine was clear with no evidence of infection. Once the stents were in place, the bladder was drained. The cystoscope was removed. The patient was returned to the supine position and awakened from laryngeal mask airway anesthesia. He was taken to the recovery room in stable condition. There were no problems or complications. The patient tolerated the procedure well.  ____________________________ Denice Bors. Jacqlyn Larsen, MD bsc:aw D: 06/22/2013 10:42:45 ET T: 06/22/2013 11:16:16 ET JOB#: [id number]  cc: Denice Bors. Jacqlyn Larsen, MD, <Dictator> Denice Bors Sayan Aldava MD ELECTRONICALLY SIGNED 06/30/2013 22:39

## 2014-10-09 NOTE — Consult Note (Signed)
Pt seen and examined. Bout of hematemesis and rectal bleeding. EGD showed large ulcer at GE junction and gastritis. Recommend PPI bid. Start clears and advance as tolerated. Hold off on colonscopy at this time. Thanks.  Electronic Signatures: Verdie Shire (MD)  (Signed on 22-Feb-13 15:04)  Authored  Last Updated: 22-Feb-13 15:04 by Verdie Shire (MD)

## 2014-10-09 NOTE — Discharge Summary (Signed)
PATIENT NAME:  [name] MR#:  [id number] DATE OF BIRTH:  12-Apr-1976  DATE OF ADMISSION:  08/09/2011 DATE OF DISCHARGE:  08/10/2011  PRIMARY CARE PHYSICIAN: Viviana Simpler, MD  REASON FOR ADMISSION: Vomiting blood, blood in the stool, and presyncope.   DISCHARGE DIAGNOSES:  1. Upper gastrointestinal bleed from esophageal ulcer and gastritis with acute posthemorrhagic anemia.  2. Presyncope secondary to volume depletion caused by anemia, hematemesis, and hematochezia. 3. Hematemesis and hematochezia secondary to upper gastrointestinal bleed from esophageal ulcer and gastritis with resultant acute posthemorrhagic anemia and presyncope.  4. Constipation.  5. Stress-induced leukocytosis.  CONSULTANT: Verdie Shire, MD - Nephrology.  DISCHARGE MEDICATIONS: Prilosec 20 mg p.o. twice a day.   DISCHARGE DISPOSITION: Home.   DISCHARGE ACTIVITY: As tolerated.  DISCHARGE DIET: Regular.  DISCHARGE INSTRUCTIONS: 1. Take medications as prescribed. 2. Do not take any NSAIDs including aspirin, ibuprofen, Aleve, Motrin, Mobic, meloxicam or any BC or Goody Powders or any NSAIDs whatsoever and do not consume any alcohol.   FOLLOWUP INSTRUCTIONS:  1. Followup with Dr. Viviana Simpler within 1 to 2 weeks. The patient needs repeat hemoglobin and hematocrit check within 1 week. His discharge hemoglobin is 9.9.   2. Follow-up with Dr. Verdie Shire within 2 weeks.   PROCEDURES: Chest x-ray, PA and lateral, on 08/09/2011: No acute cardiopulmonary abnormalities are noted.   Esophagogastroduodenoscopy on 08/09/2011: Esophageal ulcer. There is gastritis. Exam is otherwise normal. Normal examined duodenum.   PERTINENT LABORATORY DATA: BUN was 34 on admission with normal creatinine. LFTs normal on admission. Cardiac enzymes negative on admission CBC on admission: WBC 20.5, hemoglobin 11.7, hematocrit 34.1, platelets 329.  CBC at the time of discharge: WBC 6.7, hemoglobin 9.9, hematocrit 29, and platelets  260.  BRIEF HISTORY AND HOSPITAL COURSE: The patient is a 39 year old male without significant past medical history who presented to the emergency department with complaints of hematemesis, hematochezia, dizziness, and presyncope. Please see the admission history and physical for pertinent details surrounding the onset of this hospitalization. Please see below for further details.  1. Hematemesis and hematochezia - due to upper gastrointestinal bleed - with esophageal ulcer and gastritis noted on EGD. There was initially concern for upper GI bleeding as he was anemic and had elevated BUN and had some hematemesis. This was initially felt to be a brisk upper gastrointestinal bleed given hematemesis. The patient was admitted to the medical floor and started on volume resuscitation with IV fluids and placed on IV PPI therapy. We obtained serial hemoglobin and hematocrit checks. Although he did lose some blood from his hematemesis and hematochezia from upper GI bleed, he did not require blood transfusion. With volume resuscitation with IV fluids and PPI therapy his dizziness and presyncopal symptoms have resolved, as did his hematemesis and hematochezia, and he is tolerating a normal consistency diet well at the time of discharge without any symptoms or complications. Gastroenterology was consulted from the onset. Dr. Candace Cruise recommended EGD for further assessment. EGD confirmed esophageal ulcer and gastritis. The patient stated that he uses NSAIDs and alcohol very infrequently. The exact etiology of the patient's upper gastrointestinal bleed/esophageal ulcer and gastritis is unclear at this time. This could be stress induced as he did admit to moderate amount of stress recently versus possibly H. pylori-related. However, H. pylori testing was not performed with EGD. The patient can followup with Gastroenterology as an outpatient for further evaluation and for H. pylori testing. Otherwise, the recommendation was made, per  gastroenterology, to keep the patient on twice  a day PPI therapy for now and he was strongly advised to avoid any NSAIDs or alcohol. His hemoglobin did remain stable overnight, from 02/22 to 08/10/2011. He was tolerating a normal consistency diet well without any complications, he did not have any presyncopal symptoms and his dizziness had resolved, and he had no further hematemesis or hematochezia. Therefore, he was felt to be stable for discharge home with close outpatient follow-up per gastroenterology.  2. Acute posthemorrhagic anemia - due to the above with hematochezia and hematemesis from upper gastrointestinal bleed caused by esophageal ulcer and gastritis. With PPI therapy he has had no further bleeding. He had volume resuscitation with IV fluids and thereafter became asymptomatic and his hemoglobin and hematocrit have remained stable. Recommend repeat hemoglobin and hematocrit per the patient's primary care physician within one week as an outpatient.  3. Dizziness and presyncope - felt to be due to volume depletion from gastrointestinal bleed and anemia. He underwent volume resuscitation with IV fluids and did not require any blood transfusions. He experienced no syncopal episode during this hospitalization. With proton pump inhibitors and IV fluids, his dizziness resolved and he was no longer presyncopal and telemetry monitoring was benign. On 08/09/2011, the patient was hemodynamically stable and without any abdominal pain, nausea, vomiting, hematemesis, hematochezia, or presyncopal symptoms including any dizziness and he was felt to be stable for discharge to home with close outpatient follow-up to which the patient was agreeable.  TIME SPENT ON DISCHARGE: Greater than 30 minutes. ____________________________ Romie Jumper, MD knl:slb D: 08/14/2011 18:21:56 ET T: 08/15/2011 09:03:51 ET JOB#: [id number]  cc: Romie Jumper, MD, <Dictator> Venia Carbon, MD Lupita Dawn. Candace Cruise, MD Romie Jumper MD ELECTRONICALLY SIGNED 08/21/2011 21:05

## 2014-10-09 NOTE — Consult Note (Signed)
Chief Complaint:   Subjective/Chief Complaint Feels fine. No abd pain. No nausea. No further bleeding. Hungry. Tolerating clears.   VITAL SIGNS/ANCILLARY NOTES: **Vital Signs.:   23-Feb-13 09:40   Vital Signs Type Q 4hr   Temperature Temperature (F) 98.1   Celsius 36.7   Temperature Source oral   Pulse Pulse 90   Pulse source per Dinamap   Respirations Respirations 18   Systolic BP Systolic BP 697   Diastolic BP (mmHg) Diastolic BP (mmHg) 69   Mean BP 86   Pulse Ox % Pulse Ox % 99   Pulse Ox Activity Level  At rest   Oxygen Delivery Room Air/ 21 %   Brief Assessment:   Cardiac Regular    Respiratory normal resp effort    Gastrointestinal Normal   Routine Hem:  22-Feb-13 19:20    Hemoglobin (CBC) 9.9   Assessment/Plan:  Assessment/Plan:   Assessment GE junc ulcer. Prob GERD related. Stable.    Plan Repeat CBC ordered. Reg diet ordered. If tolerates reg diet and cbc stable compared to last night's, then patient can be discharged later today with f/u wit [place] in 2 wks. Thanks. Will need ppi daily for min 4-6 wks. Thanks.   Electronic Signatures: Verdie Shire (MD)  (Signed [phone] 12:34)  Authored: Chief Complaint, VITAL SIGNS/ANCILLARY NOTES, Brief Assessment, Lab Results, Assessment/Plan   Last Updated: 23-Feb-13 12:34 by Verdie Shire (MD)

## 2014-10-09 NOTE — Consult Note (Signed)
PATIENT NAME:  [name], [name] MR#:  [id number] DATE OF BIRTH:  1976-06-14  DATE OF CONSULTATION:  08/09/2011  REFERRING PHYSICIAN:  Wilfred Curtis, MD CONSULTING PHYSICIAN:  Verdie Shire, MD / Payton Emerald, NP  PRIMARY CARE PHYSICIAN: None.   REASON FOR CONSULTATION: Hematemesis, rectal bleeding.   HISTORY OF PRESENT ILLNESS: [name] is a 39 year old Caucasian gentleman who has an unremarkable past medical history. He had presented to the [facility]  Emergency Room yesterday evening. He was seen at 8:45 this morning. He was in his normal state of health up until 6 o'clock  yesterday evening when he started to experience intermittent dizzy spells. He went took a shower and stepping out of the shower he almost passed out. He vomited once with a noted trace of blood. He had eaten at 3:30 before the onset of his symptoms. After near syncopal episode, he did have a bowel movement that was dark brown and entirely bloody. His bowels move on an average every day or every other day. No abdominal pain. No nausea at this time of interviewing. No fevers. He does have well water. He denies ever having similar symptoms to this in the past. He did experience some ringing in his ears when he was in the emergency room.  Initially hemoglobin was 11.7 with a hematocrit 34.1, which did decline to 10.6.  PAST MEDICAL HISTORY: Unremarkable.   PAST SURGICAL HISTORY: Anterior cruciate ligament left knee surgery.   FAMILY HISTORY: Grandfather with history of prostate cancer. Mother with diabetes. No family history of colon cancer, other GI neoplasm, or GI concerns such as celiac or IBD.   SOCIAL HISTORY: No tobacco. History of one to two beers a week on average, but has not drank for several weeks. No history of recreational drug use. Employed as Economist.   DRUG ALLERGIES: No known drug allergies.   MEDICATIONS: Aleve as directed as needed.   REVIEW OF SYSTEMS: All 10 systems  reviewed and checked and otherwise unremarkable as stated above.   PHYSICAL EXAMINATION:   VITAL SIGNS: Vital blood pressure 113/62, respirations 20, pulse 98, temperature 98, pulse oximetry 100%.   GENERAL: Well-developed, well nourished 35-hear-old Caucasian gentleman, in no acute distress noted, pleasant. Mother at bedside. Examined in the Critical Care Unit.   HEENT: Normocephalic, atraumatic. Pupils equal and reactive to light. Conjunctivae clear. Sclerae anicteric.   NECK: Supple. Trachea midline. No lymphadenopathy or thyromegaly.   PULMONARY: Symmetric rise and fall of chest. Clear to auscultation throughout.   ABDOMEN: Soft and nondistended. Bowel sounds in all four quadrants. No bruits. No masses.   RECTAL: Deferred.   SKIN: Warm and dry. No evidence of jaundice or cyanosis.   MUSCULOSKELETAL: Moving all four extremities, 5/5 strength. No clubbing or contractures.   NEUROLOGICAL: Cranial nerves II through XII intact. No focal motor deficits.   PSYCH: Alert and oriented x4. Appropriate affect and mood.   LABS/STUDIES: Chemistry panel on admission: Glucose 126, BUN 34, calcium low at 8.0, otherwise within normal limits. Hepatic panel within normal limits. Troponin less than 0.02. WBC on admission was elevated at 20.5 and rechecked at 3:21 on 08/09/2011 and dropped to get 16.1. Antibody screen is negative. ABO group plus Rh type is O+.   Urinalysis: 50 mg/dL of glucose in urine, mucous present, otherwise unremarkable.  EKG: Sinus rhythm with possible left atrial enlargement.  Chest, PA and lateral: Lung fields clear. Heart size was normal. Chest appeared mildly hyperinflated bilaterally.  IMPRESSION: 1. Anemia.  2. Possible lower GI bleed given presentation of bright red blood, also hematemesis.   PLAN: The patient's presentation was discussed with Dr. Verdie Shire. We will proceed forward with an upper endoscopy to allow direct luminal evaluation of the upper  gastrointestinal tract to assess for source of active blood loss. The procedure risks versus benefits were discussed with the patient and his mother. N.p.o. status. Continue serial monitoring of hemoglobin and hematocrit. Transfuse as necessary. Recommend PPI therapy either twice a day regimen or drip at this time. Depending on findings of upper endoscopy, may be recommended to proceed forward with a diagnostic colonoscopy, this is still to be decided.  Thank you for asking [place] to participate in the care of Venancio Poisson.  These services provided by Payton Emerald, MS, APRN, [facility], FNP under collaborative agreement with Verdie Shire, MD. ____________________________ Payton Emerald, NP dsh:slb D: 08/09/2011 15:50:27 ET T: 08/10/2011 09:04:38 ET JOB#: [id number]  cc: Payton Emerald, NP, <Dictator> Payton Emerald MD ELECTRONICALLY SIGNED 08/14/2011 13:38

## 2014-10-09 NOTE — Consult Note (Signed)
Brief Consult Note: Diagnosis: Late Entry:  Patient was seen at 8:45 am.  Anemia, Hematemesis, lower GI bleed and near syncopal episode.   Recommend to proceed with surgery or procedure.   Orders entered.   Discussed with Attending MD.   Comments: Patient's presentation discussed with Dr. Verdie Shire.  Recommendation is to proceed forward with EGD this afternoon to allow direct luminal evaluation of upper GI tract to assess for source of active blood loss.  Procedure, risk and benefits discussed.  Recommend continued serial monitoring of H/H and transfuse as necessary.  PPI therapy.  Depending on findings of EGD may need to consider proceeding with diagnostic colonoscopy for the concern of possible lower GI bleed.  Electronic Signatures: Payton Emerald (NP)  (Signed [phone] 15:53)  Authored: Brief Consult Note   Last Updated: 22-Feb-13 15:53 by Payton Emerald (NP)

## 2014-10-09 NOTE — H&P (Signed)
PATIENT NAME:  [name], [name] MR#:  [id number] DATE OF BIRTH:  [birth year]  DATE OF ADMISSION:  08/09/2011  PRIMARY CARE PHYSICIAN: None.   CHIEF COMPLAINT: Vomiting blood and passing bowel movements that are bloody associated with near fainting.   HISTORY OF PRESENT ILLNESS: [name] is a 39 year old pleasant Caucasian male with healthy past medical history. [name] was in his usual state of health until last evening around 6 p.m. when [name] experienced intermittent dizzy spells. Thereafter, [name] went to take a shower and [name] almost passed out. Thereafter, [name] vomited once and there was trace blood. [name] eventually had a bowel movement that was dark brown that was entirely bloody. The patient finally decided to come to the Emergency Department for further evaluation. The patient has no prior history of peptic ulcer disease or any GI bleed.   REVIEW OF SYSTEMS: CONSTITUTIONAL: Denies any fever. No chills. No fatigue but reports near syncope event. EYES: No blurring of vision. No double vision. ENT: No hearing impairment. No sore throat. No dysphagia. CARDIOVASCULAR: No chest pain. No shortness of breath. No edema. Admits having near syncope event. RESPIRATORY: No shortness of breath. No cough. No sputum production. GASTROINTESTINAL: No abdominal pain. Had one episode of vomiting that had trace blood and one bloody bowel movement. GENITOURINARY: No dysuria or frequency of urination. MUSCULOSKELETAL: No joint pain or swelling. No muscular pain or swelling. INTEGUMENTARY: No skin rash. No ulcers. NEUROLOGY: No focal weakness. No seizure activity. No headache. PSYCHIATRY: No anxiety. No depression. ENDOCRINE: No night sweats. No heat or cold intolerance.   PAST MEDICAL HISTORY: Healthy. No prior history of peptic ulcer disease or inflammatory bowel disease.   FAMILY HISTORY: Negative for peptic ulcer disease and negative for inflammatory bowel disease. His mother suffers from diabetes mellitus.   ADMISSION MEDICATIONS:  None.   SOCIAL HABITS: Nonsmoker. No history of alcoholism. [name] drinks once a week just a few beers. No history of drug abuse.   SOCIAL HISTORY: [name] works in Personal assistant.   ALLERGIES: No known drug allergies.   PHYSICAL EXAMINATION:   VITAL SIGNS: Blood pressure 113/62, respiratory rate 20, pulse 98, temperature 98, oxygen saturation on room air was 100%.   GENERAL APPEARANCE: Young male laying in bed in no acute distress.   HEAD AND NECK EXAMINATION: No pallor. No icterus. No cyanosis.   EARS, NOSE, AND THROAT: Hearing was normal. Nasal mucosa, lips, tongue were normal.   EYES: Normal iris and conjunctivae. Pupils about 5 to 6 mm, equal and reactive to light.   NECK: Supple. Trachea at midline. No thyromegaly. No cervical lymphadenopathy. No masses.   HEART: Normal S1, S2. No S3, S4. No murmur. No gallop. No carotid bruits.   RESPIRATORY: Normal breathing pattern without use of accessory muscles. No rales. No wheezing.   ABDOMEN: Soft without tenderness. No hepatosplenomegaly. No masses. No hernias.   SKIN: No ulcers. No subcutaneous nodules.   MUSCULOSKELETAL: No joint swelling. No clubbing.   NEUROLOGIC: Cranial nerves II through XII are intact. No focal motor deficit.   PSYCHIATRY: The patient is alert and oriented x3. Mood and affect were normal.   LABORATORY, DIAGNOSTIC, AND RADIOLOGICAL DATA: EKG showed normal sinus rhythm at rate of 95 per minute, incomplete right bundle branch block, otherwise unremarkable EKG. Serum glucose 126, BUN 34, creatinine 1, sodium 140, potassium 4.3. His liver function tests were normal. CBC showed white count of 20,000; repeat 16,000. Hemoglobin 11.7; repeat 10.6 after IV hydration. Hematocrit 34, platelet count 329. Urinalysis  was negative. Stool Hemoccult was strongly positive.   ASSESSMENT:  1. Upper GI bleed. The fact that [name] had bloody stool may indicate rapid transit time. The elevation of BUN is more consistent with upper GI  bleed. Differential diagnosis will include peptic ulcer disease, gastritis, AV malformation, tumor formation. 2. Anemia secondary to the GI bleed. 3. Near syncope.   PLAN:  1. Admit the patient to the Intensive Care Unit.  2. IV hydration with normal saline and frequent monitoring of his hemoglobin q.8 hours.  3. IV Protonix drip.  4. Keep patient n.p.o.  5. Obtain GI consultation.  6. Peptic ulcer disease prophylaxis is obtained with Protonix.  7. Deep vein thrombosis prophylaxis. Will use TED stockings since chemical anticoagulation is contraindicated in the setting of GI bleed.   ____________________________ Clovis Pu. Lenore Manner, MD amd:drc D: 08/09/2011 05:09:02 ET T: 08/09/2011 06:37:50 ET JOB#: [id number]  cc: Clovis Pu. Lenore Manner, MD, <Dictator> Ellin Saba MD ELECTRONICALLY SIGNED 08/09/2011 22:21
